# Patient Record
Sex: Female | Born: 1945 | Race: White | Hispanic: No | Marital: Married | Smoking: Former smoker
Health system: Southern US, Community
[De-identification: ages and names within clinical notes are randomized; demographics above are authoritative.]

## PROBLEM LIST (undated history)

## (undated) DIAGNOSIS — E7800 Pure hypercholesterolemia, unspecified: Secondary | ICD-10-CM

## (undated) DIAGNOSIS — M1990 Unspecified osteoarthritis, unspecified site: Secondary | ICD-10-CM

## (undated) DIAGNOSIS — K219 Gastro-esophageal reflux disease without esophagitis: Secondary | ICD-10-CM

## (undated) DIAGNOSIS — K589 Irritable bowel syndrome without diarrhea: Secondary | ICD-10-CM

## (undated) DIAGNOSIS — A048 Other specified bacterial intestinal infections: Secondary | ICD-10-CM

## (undated) DIAGNOSIS — E785 Hyperlipidemia, unspecified: Secondary | ICD-10-CM

## (undated) DIAGNOSIS — C801 Malignant (primary) neoplasm, unspecified: Secondary | ICD-10-CM

## (undated) HISTORY — DX: Irritable bowel syndrome, unspecified: K58.9

## (undated) HISTORY — DX: Gastro-esophageal reflux disease without esophagitis: K21.9

## (undated) HISTORY — DX: Pure hypercholesterolemia, unspecified: E78.00

## (undated) HISTORY — DX: Hyperlipidemia, unspecified: E78.5

## (undated) HISTORY — DX: Other specified bacterial intestinal infections: A04.8

## (undated) HISTORY — PX: TUBAL LIGATION: SHX77

---

## 2002-11-15 ENCOUNTER — Ambulatory Visit (HOSPITAL_COMMUNITY): Admission: RE | Admit: 2002-11-15 | Discharge: 2002-11-15 | Payer: Self-pay | Admitting: Internal Medicine

## 2002-11-15 HISTORY — PX: COLONOSCOPY: SHX174

## 2006-08-02 ENCOUNTER — Ambulatory Visit: Payer: Self-pay | Admitting: Gastroenterology

## 2010-03-23 DIAGNOSIS — A048 Other specified bacterial intestinal infections: Secondary | ICD-10-CM

## 2010-03-23 HISTORY — DX: Other specified bacterial intestinal infections: A04.8

## 2010-08-05 NOTE — Assessment & Plan Note (Signed)
[facility]                         GASTROENTEROLOGY OFFICE NOTE   NAME:Michele Villa, Michele Villa                      MRN:          [id number]  DATE:08/02/2006                            DOB:          1946/02/11    REASON FOR CONSULTATION:  Fever and abdominal pain.   Michele Villa is a 65 year old white female referred through the  courtesy of Dr.  Margo Common for evaluation. For the last 3 to 4 months, she  has been having what sounds like a rigor and low-grade fevers to 100 to  101. This may occur about once a month. On the last occasion, she  developed diffuse lower aching abdominal pain as well. This pain lasted  several hours. Michele Villa has a history of irritable bowel syndrome  characterized by alternating episodes of diarrhea and constipation. She  has frequent pyrosis and occasional regurgitation of gastric contents.  She recently underwent a CT scan that was unremarkable as well as a CBC.  She has frequent sinus drainage and non-productive cough. She denies  dysphagia or sore throat. She underwent colonoscopy four years ago that  apparently was normal.   PAST MEDICAL HISTORY:  1. Arthritis.  2. She is status post tubal ligation.   FAMILY HISTORY:  Is noncontributory.   CURRENT MEDICATIONS:  On no medications.   ALLERGIES:  She has no allergies.   SOCIAL HISTORY:  She does not smoke. She drinks rarely. She is married  and works as a Nutritional therapist.   REVIEW OF SYSTEMS:  Positive for joint pains, night sweats and back  pain.   PHYSICAL EXAMINATION:  Pulse 80, blood pressure 120/82, weight 139.   PHYSICAL EXAMINATION:  HEENT: EOMI. PERRLA. Sclerae are anicteric.  Conjunctivae are pink.  NECK:  Supple without thyromegaly, adenopathy or carotid bruits.  CHEST:  Clear to auscultation and percussion without adventitious  sounds.  CARDIAC:  Regular rhythm; normal S1 S2.  There are no murmurs, gallops  or rubs.  ABDOMEN:  Bowel sounds are  normoactive.  Abdomen is soft, non-tender and  non-distended.  There are no abdominal masses, tenderness, splenic  enlargement or hepatomegaly.  EXTREMITIES:  Full range of motion.  No cyanosis, clubbing or edema.  RECTAL:  Deferred.   IMPRESSION:  1. Abdominal pain, very nonspecific symptoms and it may be a      reflection of irritable bowel syndrome.  2. Irritable bowel syndrome.  3. History of low-grade fevers and chills. She has frequent sinus      drainage and may develop low-grade inflammation from this. There is      no evidence of active intra-abdominal process.  4. Gastroesophageal reflux disease.   RECOMMENDATION:  1. Fiber supplementation.  2. Prevacid 30 mg a day.   I carefully instructed Michele Villa to contact me if symptoms worsen  with regards to fevers, fever frequency or abdominal pain. In the  absence of this, I will reevaluate her in approximately one month.     Barbette Hair. Arlyce Dice, MD,FACG  Electronically Signed    RDK/MedQ  DD: 08/02/2006  DT: 08/02/2006  Job #: [id number]   cc:  Wyvonnia Lora

## 2010-08-05 NOTE — Letter (Signed)
Aug 02, 2006    Mrs. Willette Cluster   RE:  SHOLONDA, JOBST  MRN:  [id number]  /  DOB:  05/04/1945   Dear Mrs. Martian:   It is my pleasure to have treated you recently as a new patient in my  office.  I appreciate your confidence and the opportunity to participate  in your care.   Since I do have a busy inpatient endoscopy schedule and office schedule,  my office hours vary weekly.  I am, however, available for emergency  calls every day through my office.  If I cannot promptly meet an urgent  office appointment, another one of our gastroenterologists will be able  to assist you.   My well-trained staff are prepared to help you at all times.  For  emergencies after office hours, a physician from our gastroenterology  section is always available through my 24-hour answering service.   While you are under my care, I encourage discussion of your questions  and concerns, and I will be happy to return your calls as soon as I am  available.   Once again, I welcome you as a new patient and I look forward to a happy  and healthy relationship.    Sincerely,      Barbette Hair. Arlyce Dice, MD,FACG  Electronically Signed   RDK/MedQ  DD: 08/02/2006  DT: 08/02/2006  Job #: [id number]

## 2010-08-05 NOTE — Letter (Signed)
Aug 02, 2006    Wyvonnia Lora, MD  [address]  [place], [place] [address]   RE:  [name], [name]  MRN:  [id number]  /  DOB:  09-29-1945   Dear Dr. Margo Common:   Upon your kind referral, I had the pleasure of evaluating your patient  and I am pleased to offer my findings.  I saw Mrs. Michele Villa in the  office today.  Enclosed is a copy of my progress note that details my  findings and recommendations.   Thank you for the opportunity to participate in your patient's care.    Sincerely,      Barbette Hair. Arlyce Dice, MD,FACG  Electronically Signed    RDK/MedQ  DD: 08/02/2006  DT: 08/02/2006  Job #: [id number]

## 2010-08-08 NOTE — Consult Note (Signed)
NAME:  Michele Villa, Michele Villa               ACCOUNT NO.:  [id number]   MEDICAL RECORD NO.:  [id number]                  PATIENT TYPE:   LOCATION:                                       FACILITY:   PHYSICIAN:  R. Roetta Sessions, M.D.              DATE OF BIRTH:  07-11-1945   DATE OF CONSULTATION:  10/25/2002  DATE OF DISCHARGE:                                   CONSULTATION   CHIEF COMPLAINT:  Intermittent diarrhea.   HISTORY OF PRESENT ILLNESS:  Mrs. Michele Villa is a pleasant 65 year old  lady referred over at the courtesy of Dr. Wyvonnia Lora to further evaluate a  greater than 10-year history of intermittent postprandial abdominal cramps  and diarrhea.  She has one to six bowel movements daily.  On a good day she  may have one formed stool; on other days she may have six semi-formed loose  stools.  She really does not have any nocturnal diarrhea and has several  days out of a month where she has normal bowel function.  Sometimes she gets  the acute urge to have a bowel movement, occasionally has bouts of  incontinence.  She was last seen in 1995 for these symptoms.  Stool studies  were negative.  Sigmoidoscopy at 25 cm was negative at [facility].  It was felt she had diarrhea from irritable bowel syndrome.  I prescribed  her some sublingual Levsin.  She does not recall taking Levsin or any  benefit from that approach.  There is no family history of colorectal  neoplasia although her mother has colonic polyps and Michele Villa has never  had a full colonoscopy.  No upper GI tract symptoms such as odynophagia,  dysphagia, early satiety, reflux symptoms, nausea, vomiting.  Again, there  has been no melena or rectal bleeding.  She has gained 7 pounds since she  was last seen 10 years ago.   PAST MEDICAL HISTORY:  Otherwise unremarkable for chronic illnesses.   PAST SURGERY:  Tubal ligation.   CURRENT MEDICATIONS:  None.   ALLERGIES:  No known drug allergies.   FAMILY HISTORY:  Mother has chronic COPD, colonic polyps.  Father died with  lung cancer.  Otherwise negative.   SOCIAL HISTORY:  The patient has been married for 10 years, has one  daughter.  Employed with [employer].  Does not use tobacco.  Occasionally  has a glass of wine.   REVIEW OF SYSTEMS:  As in history of present illness.   PHYSICAL EXAMINATION:  GENERAL:  Reveals a pleasant 65 year old lady resting  comfortably.  VITAL SIGNS:  Weight 142, blood pressure 100/80, pulse 86.  SKIN:  Warm and dry, no lesions.  HEENT:  No scleral icterus, conjunctivae are pink. Oral cavity with no  lesions.  JVP is not prominent.  CHEST:  Lungs are clear to auscultation.  CARDIAC:  Regular rate and rhythm without murmur, gallop, or rub.  ABDOMEN:  Nondistended, positive bowel sounds, soft,  nontender.  No  appreciable mass or organomegaly.  RECTAL:  Deferred to time of colonoscopy.   IMPRESSION:  Mrs. Michele Villa is a pleasant 65 year old lady with  intermittent postprandial abdominal cramps and diarrhea, relatively rare  episodes of rectal incontinence.  Symptoms have been ongoing for some 10  years.   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  I  believe this diagnosis has been a durable one over time.   She would benefit from a multi-pronged approach to irritable bowel syndrome.  In addition, she really needs to have a colonoscopy as this has not been  done.  She is past the threshold age for colorectal cancer screening.  To  this end I have offered Mrs. Michele Villa a colonoscopy.  The potential risks,  benefits, alternatives have been reviewed.  Her questions were answered.  She is agreeable.   PLAN:  Colonoscopy in the near future at [facility].  Will then  make further recommendations in terms of the management of irritable bowel  syndrome.   I would like to thank Dr. Wyvonnia Lora for allowing me to see this nice lady  once again.                                                Jonathon Bellows, M.D.    RMR/MEDQ  D:  10/25/2002  T:  10/25/2002  Job:  [id number]   cc:   Wyvonnia Lora  [address]  [place]  [place] [address]  Fax: [phone]

## 2010-08-08 NOTE — Op Note (Signed)
NAME:  Michele Villa, Michele Villa                         ACCOUNT NO.:  [id number]   MEDICAL RECORD NO.:  [id number]                   PATIENT TYPE:  AMB   LOCATION:  DAY                                  FACILITY:  [facility]   PHYSICIAN:  R. Roetta Sessions, M.D.              DATE OF BIRTH:  1945/08/26   DATE OF PROCEDURE:  11/15/2002  DATE OF DISCHARGE:                                  PROCEDURE NOTE   PROCEDURE:  Screening colonoscopy.   INDICATIONS FOR PROCEDURE:  The patient is a 65 year old lady with  longstanding intermittent postprandial abdominal cramps and diarrhea, rare  episode of occasional rectal incontinence.  Symptoms have been going on for  over 10 years.  She is felt to have diarrhea-predominant IBS.  She has not  had her entire lower GI tract evaluated.  She is due for screening;  consequently, colonoscopy is now being done.  This approach has been  discussed with the patient.  Potential risks, benefits, alternatives have  been reviewed, questions answered.  Please see my October 25, 2002  Consultation Note.   PROCEDURE NOTE:  O2 saturation, blood pressure, pulse, and respirations were  monitored throughout the entire procedure.   CONSCIOUS SEDATION:  Versed 3 mg IV, Demerol 50 mg IV in divided doses.   INSTRUMENT:  Olympus video chip pediatric colonoscope.   FINDINGS:  Digital rectal exam revealed no abnormalities using the  ___________.  Her prep was good.   RECTOCOLON EXAMINATION:  Rectal mucosa including retroflexed view of the  anal verge revealed only a couple of anal papilla.   COLON:  Colonic mucosa was surveyed from the rectosigmoid junction through  the left, transverse, and right colon to the area of the appendiceal  orifice.   ILEOCECAL VALVE AND CECUM:  These structures well seen and photographed  throughout.  The colonic mucosa all the way to the cecum appeared normal.  The terminal ileum was entered at 10 cm.  This segment of the GI tract also  appeared  normal.  From this level, the scope was slowly withdrawn.  All  previously imaged mucosal surfaces were again seen and again no  abnormalities were observed.  The patient tolerated the procedure well, was  reacted in the endoscopy room.   IMPRESSION:  Two anal papilla; otherwise, normal rectum, normal colon,  normal terminal ileum.    RECOMMENDATIONS:  NuLev one tablet on the tongue before meals and at bedtime  as needed for occasional abdominal cramps and diarrhea.  Follow-up will be  in six months.                                               Jonathon Bellows, M.D.    RMR/MEDQ  D:  11/15/2002  T:  11/15/2002  Job:  [id number]   cc:   Wyvonnia Lora  [address]  [place]  [place] [address]  Fax: [phone]

## 2012-04-21 ENCOUNTER — Telehealth: Payer: Self-pay | Admitting: *Deleted

## 2012-04-21 NOTE — Telephone Encounter (Signed)
Michele Villa called today. She had a TCS 10 years ago and is due again this year. She would like to see if she can be triaged over the phone or if she will need an office visit. She is not due until August.  Please follow up with her. Thank you.

## 2012-04-25 NOTE — Telephone Encounter (Signed)
Pt returned call and said she is having some IBS problems. She is having more frequent Bm's and she is becoming incontinent of the stools. Ov scheduled with Lorenza Burton, NP on 05/12/2012 at 11:00 am.

## 2012-04-25 NOTE — Telephone Encounter (Signed)
LMOM to call.

## 2012-05-11 ENCOUNTER — Encounter: Payer: Self-pay | Admitting: Internal Medicine

## 2012-05-12 ENCOUNTER — Encounter: Payer: Self-pay | Admitting: Urgent Care

## 2012-05-12 ENCOUNTER — Ambulatory Visit (INDEPENDENT_AMBULATORY_CARE_PROVIDER_SITE_OTHER): Payer: 59 | Admitting: Urgent Care

## 2012-05-12 VITALS — BP 129/77 | HR 74 | Temp 97.6°F | Ht 63.5 in | Wt 135.6 lb

## 2012-05-12 DIAGNOSIS — R1310 Dysphagia, unspecified: Secondary | ICD-10-CM | POA: Insufficient documentation

## 2012-05-12 DIAGNOSIS — R197 Diarrhea, unspecified: Secondary | ICD-10-CM | POA: Insufficient documentation

## 2012-05-12 DIAGNOSIS — K219 Gastro-esophageal reflux disease without esophagitis: Secondary | ICD-10-CM

## 2012-05-12 DIAGNOSIS — R1314 Dysphagia, pharyngoesophageal phase: Secondary | ICD-10-CM

## 2012-05-12 DIAGNOSIS — R159 Full incontinence of feces: Secondary | ICD-10-CM

## 2012-05-12 MED ORDER — HYOSCYAMINE SULFATE 0.125 MG SL SUBL: 0.1250 mg | SUBLINGUAL_TABLET | SUBLINGUAL | Status: DC | PRN

## 2012-05-12 MED ORDER — PEG 3350-KCL-NA BICARB-NACL 420 G PO SOLR: 4000.0000 mL | ORAL | Status: DC

## 2012-05-12 NOTE — Assessment & Plan Note (Addendum)
Michele Villa is a pleasant 67 y.o. female with history of IBS-D. Last colonoscopy was back in 2004. Over the last several years, she reports irritable bowel syndrome has been worse. She is having symptoms nearly every day now including diarrhea, hematochezia and fecal incontinence. She'll need an ileocolonoscopy with Dr. Jena Gauss with possible random biopsies to rule out microscopic colitis, inflammatory bowel disease, colorectal polyp or carcinom, or benign anorectal source.   I have discussed risks & benefits which include, but are not limited to, bleeding, infection, perforation & drug reaction.  The patient agrees with this plan & written consent will be obtained.    TTG IgA, IgA, CBC, CMP, TSH Use Levsin 0.125 mg before meals and at bedtime (up to 4 times a day) for abdominal cramps and diarrhea Call if any questions or problems.

## 2012-05-12 NOTE — Patient Instructions (Addendum)
You will need a colonoscopy, upper endoscopy, possible colon and small bowel biopsies, and possible dilation of your esophagus with Dr. Jena Gauss Use Levsin 0.125 mg before meals and at bedtime (up to 4 times a day) for abdominal cramps and diarrhea Please get your labs as soon as possible.  We will call you with results. Continue omeprazole 20 mg daily for acid reflux Call if any questions or problems. Dysphagia Swallowing problems (dysphagia) occur when solids and liquids seem to stick in your throat on the way down to your stomach, or the food takes longer to get to the stomach. Other symptoms (problems) include regurgitating (burping) up food, noises coming from the throat, chest discomfort with swallowing, and a feeling of fullness in the throat when swallowing. When blockage in the throat is complete it may be associated with drooling. CAUSES There are many causes of swallowing difficulties and the following is generalized information regarding a number of reasons for this problem. Problems with swallowing may occur because of problems with the muscles. The food cannot be propelled in the usual manner into the stomach. There may be ulcers, scar tissue, or inflammation (soreness) in the esophagus (the food tube from the mouth to the stomach) which blocks food from passing normally into the stomach. Causes of inflammation include acid reflux from the stomach into the esophagus. Inflammation can also be caused by the herpes simplex virus, Candida (yeast), radiation (as with treatment of cancer), or inflammation from medicines not taken with adequate fluids to wash them down into the stomach. There may be nerve problems so signals cannot be sent adequately telling the muscles of the esophagus to contract and move the food along. Achalasia is a rare disorder of the esophagus in which muscular contractions of the esophagus are uncoordinated. Globus hystericus is a relatively common problem in young females in  which there is a sense of an obstruction or difficulty in swallowing, but in which no abnormalities can be found. This problem usually improves over time with reassurance and testing to rule out other causes. DIAGNOSIS A number of tests will help your caregiver know what is the cause of your swallowing problems. These tests may include a barium swallow in which X-rays are taken while you are drinking a liquid that outlines the lining of the esophagus on X-ray. If the stomach and small bowel are also studied in this manner it is called an upper gastrointestinal exam (UGI). Endoscopy may be done in which your caregiver examines your throat, esophagus, stomach, and small bowel with a small, flexible scope. Motility studies which measure the effectiveness and coordination of the muscular contractions of the esophagus may also be done. TREATMENT The treatment of swallowing problems are many, varying from medicines to surgical treatment. The treatment varies with the type of problem found. Your caregiver will discuss your results and treatment with you. If swallowing problems are severe the long-term problems which may occur include: malnutrition, pneumonia (from food going into the breathing tubes called trachea and bronchi), and an increase in tumors (lumps) of the esophagus. SEEK IMMEDIATE MEDICAL CARE IF:  Food or another object becomes lodged in your throat or esophagus and will not move. Document Released: 03/06/2000 Document Revised: 09/08/2011 Document Reviewed: 10/26/2007 [facility] Patient Information 2013 [place], [place]. Diarrhea Infections caused by germs (bacterial) or a virus commonly cause diarrhea. Your caregiver has determined that with time, rest and fluids, the diarrhea should improve. In general, eat normally while drinking more water than usual. Although water may prevent  dehydration, it does not contain salt and minerals (electrolytes). Broths, weak tea without caffeine and oral rehydration  solutions (ORS) replace fluids and electrolytes. Small amounts of fluids should be taken frequently. Large amounts at one time may not be tolerated. Plain water may be harmful in infants and the elderly. Oral rehydrating solutions (ORS) are available at pharmacies and grocery stores. ORS replace water and important electrolytes in proper proportions. Sports drinks are not as effective as ORS and may be harmful due to sugars worsening diarrhea.  ORS is especially recommended for use in children with diarrhea. As a general guideline for children, replace any new fluid losses from diarrhea and/or vomiting with ORS as follows:  If your child weighs 22 pounds or under (10 kg or less), give 60-120 mL ( -  cup or 2 - 4 ounces) of ORS for each episode of diarrheal stool or vomiting episode.  If your child weighs more than 22 pounds (more than 10 kgs), give 120-240 mL ( - 1 cup or 4 - 8 ounces) of ORS for each diarrheal stool or episode of vomiting.  While correcting for dehydration, children should eat normally. However, foods high in sugar should be avoided because this may worsen diarrhea. Large amounts of carbonated soft drinks, juice, gelatin desserts and other highly sugared drinks should be avoided.  After correction of dehydration, other liquids that are appealing to the child may be added. Children should drink small amounts of fluids frequently and fluids should be increased as tolerated. Children should drink enough fluids to keep urine clear or pale yellow.  Adults should eat normally while drinking more fluids than usual. Drink small amounts of fluids frequently and increase as tolerated. Drink enough fluids to keep urine clear or pale yellow. Broths, weak decaffeinated tea, lemon lime soft drinks (allowed to go flat) and ORS replace fluids and electrolytes.  Avoid:  Carbonated drinks.  Juice.  Extremely hot or cold fluids.  Caffeine drinks.  Fatty, greasy  foods.  Alcohol.  Tobacco.  Too much intake of anything at one time.  Gelatin desserts.  Probiotics are active cultures of beneficial bacteria. They may lessen the amount and number of diarrheal stools in adults. Probiotics can be found in yogurt with active cultures and in supplements.  Wash hands well to avoid spreading bacteria and virus.  Anti-diarrheal medications are not recommended for infants and children.  Only take over-the-counter or prescription medicines for pain, discomfort or fever as directed by your caregiver. Do not give aspirin to children because it may cause Reye's Syndrome.  For adults, ask your caregiver if you should continue all prescribed and over-the-counter medicines.  If your caregiver has given you a follow-up appointment, it is very important to keep that appointment. Not keeping the appointment could result in a chronic or permanent injury, and disability. If there is any problem keeping the appointment, you must call back to this facility for assistance. SEEK IMMEDIATE MEDICAL CARE IF:   You or your child is unable to keep fluids down or other symptoms or problems become worse in spite of treatment.  Vomiting or diarrhea develops and becomes persistent.  There is vomiting of blood or bile (green material).  There is blood in the stool or the stools are black and tarry.  There is no urine output in 6-8 hours or there is only a small amount of very dark urine.  Abdominal pain develops, increases or localizes.  You have a fever.  Your baby is older than  3 months with a rectal temperature of 102 F (38.9 C) or higher.  Your baby is 54 months old or younger with a rectal temperature of 100.4 F (38 C) or higher.  You or your child develops excessive weakness, dizziness, fainting or extreme thirst.  You or your child develops a rash, stiff neck, severe headache or become irritable or sleepy and difficult to awaken. MAKE SURE YOU:   Understand  these instructions.  Will watch your condition.  Will get help right away if you are not doing well or get worse. Document Released: 02/27/2002 Document Revised: 06/01/2011 Document Reviewed: 01/14/2009 [facility] Patient Information 2013 [place], [place].

## 2012-05-12 NOTE — Progress Notes (Signed)
Primary Care Physician:  Estanislado Pandy, MD Primary Gastroenterologist:  Dr. Jena Gauss  Chief Complaint  Patient presents with  . Diarrhea  . Irritable Bowel Syndrome    HPI:  Michele Villa is a 67 y.o. female here due to worsening diarrhea.  She had hx of IBS-D.  Her last colonoscopy was August 2004 by Dr. Jena Gauss and she had 2 anal papilla and otherwise normal exam. She has been having 4-6 small BMs with incontinence if she does not go right away.  She describes her stools as Bristol #5.  Denies mucus or blood in her stools.  She has been exercising & walking & she has to run to the bathroom with urgency. She has noticed bright red blood in her underwear.  She feels irritated.  Denies fever, chills or rash.  She has intermittent proctalgia and anorectal pruritis.  She has been taking OTC anti-diarrheal meds when necessary every 2-3 days, then she feels constipated.   Denies nausea, vomiting, dysphagia, odynophagia or anorexia.  She has also noticed heartburn & indigestion that is worse over the last 10 years.  She is on prilosec 20mg  daily PRN.  She has been having dysphagia.  She needs to eat her food slowly.  She feels like solid foods get stuck retrosternally.  She denies any problems w/ liquids.  Denies regurgitation.  Denies recent antibiotics, foreign travel or new medications. Her weight has been stable. Past Medical History  Diagnosis Date  . IBS (irritable bowel syndrome)   . Hyperlipidemia   . GERD (gastroesophageal reflux disease)   . H. pylori infection 2012    treated by Dr Neita Carp    Past Surgical History  Procedure Laterality Date  . Colonoscopy  11/15/2002    RMR:Two anal papilla; otherwise, normal rectum, normal colon normal terminal ileum  . Tubal ligation      Current Outpatient Prescriptions  Medication Sig Dispense Refill  . hyoscyamine (LEVSIN SL) 0.125 MG SL tablet Place 1 tablet (0.125 mg total) under the tongue every 4 (four) hours as needed for cramping.  60 tablet   0  . polyethylene glycol-electrolytes (TRILYTE) 420 G solution Take 4,000 mLs by mouth as directed.  4000 mL  0   No current facility-administered medications for this visit.    Allergies as of 05/12/2012 - Review Complete 05/12/2012  Allergen Reaction Noted  . Statins  05/12/2012    Family History:There is no known family history of colorectal carcinoma , liver disease, or inflammatory bowel disease.  Problem Relation Age of Onset  . GER disease Mother   . Diabetes Brother   . CAD    . Lung cancer Father   . GER disease Father     History   Social History  . Marital Status: Married    Spouse Name: N/A    Number of Children: 1  . Years of Education: N/A   Occupational History  . retired, [employer] Nutritional therapist    Social History Main Topics  . Smoking status: Former Smoker -- 15.00 packs/day    Types: Cigarettes    Quit date: 03/23/1980  . Smokeless tobacco: Not on file  . Alcohol Use: No     Comment: wine or mixed drink couple times per mo  . Drug Use: No  . Sexually Active: Not on file   Other Topics Concern  . Not on file   Social History Narrative   Lives w/ husband    Review of Systems: Gen: Denies any fever, chills, sweats,  anorexia, fatigue, weakness, malaise, weight loss, and sleep disorder CV: Denies chest pain, angina, palpitations, syncope, orthopnea, PND, peripheral edema, and claudication. Resp: Denies dyspnea at rest, dyspnea with exercise, cough, sputum, wheezing, coughing up blood, and pleurisy. GI: Denies vomiting blood, jaundice. GU : Denies urinary burning, blood in urine, urinary frequency, urinary hesitancy, nocturnal urination, and urinary incontinence. MS: Denies joint pain, limitation of movement, and swelling, stiffness, low back pain, extremity pain. Denies muscle weakness, cramps, atrophy.  Derm: Denies rash, itching, dry skin, hives, moles, warts, or unhealing ulcers.  Psych: Denies depression, anxiety, memory loss, suicidal  ideation, hallucinations, paranoia, and confusion. Heme: Denies bruising, bleeding, and enlarged lymph nodes. Neuro:  Denies any headaches, dizziness, paresthesias. Endo:  Denies any problems with DM, thyroid, adrenal function.  Physical Exam: BP 129/77  Pulse 74  Temp(Src) 97.6 F (36.4 C) (Oral)  Ht 5' 3.5" (1.613 m)  Wt 135 lb 9.6 oz (61.508 kg)  BMI 23.64 kg/m2 No LMP recorded. Patient is not currently having periods (Reason: Other). General:   Alert,  Well-developed, well-nourished, pleasant and cooperative in NAD Head:  Normocephalic and atraumatic. Eyes:  Sclera clear, no icterus.   Conjunctiva pink. Ears:  Normal auditory acuity. Nose:  No deformity, discharge, or lesions. Mouth:  No deformity or lesions,oropharynx pink & moist. Neck:  Supple; no masses or thyromegaly. Lungs:  Clear throughout to auscultation.   No wheezes, crackles, or rhonchi. No acute distress. Heart:  Regular rate and rhythm; no murmurs, clicks, rubs,  or gallops. Abdomen:  Normal bowel sounds.  No bruits.  Soft, non-tender and non-distended without masses, hepatosplenomegaly or hernias noted.  No guarding or rebound tenderness.   Rectal:  Deferred.  Msk:  Symmetrical without gross deformities. Normal posture. Pulses:  Normal pulses noted. Extremities:  No clubbing or edema. Neurologic:  Alert and oriented x4;  grossly normal neurologically. Skin:  Intact without significant lesions or rashes. Lymph Nodes:  No significant cervical adenopathy. Psych:  Alert and cooperative. Normal mood and affect.

## 2012-05-12 NOTE — Assessment & Plan Note (Signed)
She has chronic GERD and solid food dysphagia. She's never had an EGD before.  Will need EGD with possible esophageal dilation with Dr. Jena Gauss to look for esophageal web, ring or stricture.  I have discussed risks & benefits which include, but are not limited to, bleeding, infection, perforation & drug reaction.  The patient agrees with this plan & written consent will be obtained.

## 2012-05-12 NOTE — Progress Notes (Signed)
Faxed to PCP

## 2012-05-12 NOTE — Assessment & Plan Note (Signed)
Will need colonoscopy to help determine source.

## 2012-05-12 NOTE — Assessment & Plan Note (Signed)
She is currently taking when necessary omeprazole. I've asked her to increase this to omeprazole 20 mg daily. EGD pending.

## 2012-05-13 ENCOUNTER — Encounter (HOSPITAL_COMMUNITY): Payer: Self-pay | Admitting: Pharmacy Technician

## 2012-05-13 LAB — COMPREHENSIVE METABOLIC PANEL
ALT: 14 U/L (ref 0–35)
CO2: 26 mEq/L (ref 19–32)
Sodium: 138 mEq/L (ref 135–145)
Total Bilirubin: 0.5 mg/dL (ref 0.3–1.2)
Total Protein: 6.6 g/dL (ref 6.0–8.3)

## 2012-05-13 LAB — CBC WITH DIFFERENTIAL/PLATELET
Eosinophils Absolute: 0.1 10*3/uL (ref 0.0–0.7)
Lymphocytes Relative: 35 % (ref 12–46)
Lymphs Abs: 1.7 10*3/uL (ref 0.7–4.0)
Neutrophils Relative %: 54 % (ref 43–77)
Platelets: 225 10*3/uL (ref 150–400)
RBC: 4.93 MIL/uL (ref 3.87–5.11)
WBC: 4.9 10*3/uL (ref 4.0–10.5)

## 2012-05-16 LAB — TISSUE TRANSGLUTAMINASE, IGA: Tissue Transglutaminase Ab, IgA: 4.2 U/mL (ref <20)

## 2012-05-17 NOTE — Progress Notes (Signed)
Quick Note:  Please let pt know Blood ct, electrolytes, liver & thyroid normal. No evidence of celiac dx on labs. Keep colonoscopy & EGD as planned PI:RJJOAC,ZYSA W, MD  ______

## 2012-05-18 NOTE — Progress Notes (Signed)
Quick Note:  Pt aware. Tcs/egd scheduled for 05/25/12 Dawn, please cc pcp ______

## 2012-05-18 NOTE — Progress Notes (Signed)
Results faxed to PCP 

## 2012-05-25 ENCOUNTER — Encounter (HOSPITAL_COMMUNITY): Payer: Self-pay | Admitting: *Deleted

## 2012-05-25 ENCOUNTER — Encounter (HOSPITAL_COMMUNITY)
Admission: RE | Disposition: A | Discharge status: Home / Self Care | Payer: Self-pay | Source: Ambulatory Visit | Attending: Internal Medicine

## 2012-05-25 ENCOUNTER — Ambulatory Visit (HOSPITAL_COMMUNITY)
Admission: RE | Admit: 2012-05-25 | Discharge: 2012-05-25 | Disposition: A | Discharge status: Home / Self Care | Payer: 59 | Source: Ambulatory Visit | Attending: Internal Medicine | Admitting: Internal Medicine

## 2012-05-25 DIAGNOSIS — R197 Diarrhea, unspecified: Secondary | ICD-10-CM

## 2012-05-25 DIAGNOSIS — R159 Full incontinence of feces: Secondary | ICD-10-CM

## 2012-05-25 DIAGNOSIS — E785 Hyperlipidemia, unspecified: Secondary | ICD-10-CM | POA: Insufficient documentation

## 2012-05-25 DIAGNOSIS — K219 Gastro-esophageal reflux disease without esophagitis: Secondary | ICD-10-CM

## 2012-05-25 DIAGNOSIS — K921 Melena: Secondary | ICD-10-CM | POA: Insufficient documentation

## 2012-05-25 DIAGNOSIS — K222 Esophageal obstruction: Secondary | ICD-10-CM

## 2012-05-25 DIAGNOSIS — K2100 Gastro-esophageal reflux disease with esophagitis, without bleeding: Secondary | ICD-10-CM | POA: Insufficient documentation

## 2012-05-25 DIAGNOSIS — R1310 Dysphagia, unspecified: Secondary | ICD-10-CM | POA: Insufficient documentation

## 2012-05-25 DIAGNOSIS — R1314 Dysphagia, pharyngoesophageal phase: Secondary | ICD-10-CM

## 2012-05-25 DIAGNOSIS — K5730 Diverticulosis of large intestine without perforation or abscess without bleeding: Secondary | ICD-10-CM | POA: Insufficient documentation

## 2012-05-25 HISTORY — PX: ESOPHAGOGASTRODUODENOSCOPY (EGD) WITH ESOPHAGEAL DILATION: SHX5812

## 2012-05-25 HISTORY — PX: COLONOSCOPY: SHX5424

## 2012-05-25 HISTORY — DX: Unspecified osteoarthritis, unspecified site: M19.90

## 2012-05-25 HISTORY — PX: BIOPSY: SHX5522

## 2012-05-25 SURGERY — COLONOSCOPY
Anesthesia: Moderate Sedation

## 2012-05-25 MED ORDER — MIDAZOLAM HCL 5 MG/5ML IJ SOLN
INTRAMUSCULAR | Status: AC
  Filled 2012-05-25: qty 10

## 2012-05-25 MED ORDER — ONDANSETRON HCL 4 MG/2ML IJ SOLN
INTRAMUSCULAR | Status: DC | PRN
  Administered 2012-05-25: 4 mg via INTRAVENOUS

## 2012-05-25 MED ORDER — MIDAZOLAM HCL 5 MG/5ML IJ SOLN
INTRAMUSCULAR | Status: DC | PRN
  Administered 2012-05-25: 2 mg via INTRAVENOUS
  Administered 2012-05-25: 1 mg via INTRAVENOUS
  Administered 2012-05-25: 2 mg via INTRAVENOUS

## 2012-05-25 MED ORDER — BUTAMBEN-TETRACAINE-BENZOCAINE 2-2-14 % EX AERO
INHALATION_SPRAY | CUTANEOUS | Status: DC | PRN
  Administered 2012-05-25: 1 via TOPICAL

## 2012-05-25 MED ORDER — ONDANSETRON HCL 4 MG/2ML IJ SOLN
INTRAMUSCULAR | Status: AC
  Filled 2012-05-25: qty 2

## 2012-05-25 MED ORDER — MEPERIDINE HCL 100 MG/ML IJ SOLN
INTRAMUSCULAR | Status: AC
  Filled 2012-05-25: qty 2

## 2012-05-25 MED ORDER — MEPERIDINE HCL 100 MG/ML IJ SOLN
INTRAMUSCULAR | Status: DC | PRN
  Administered 2012-05-25: 25 mg via INTRAVENOUS
  Administered 2012-05-25: 50 mg via INTRAVENOUS
  Administered 2012-05-25: 25 mg via INTRAVENOUS

## 2012-05-25 MED ORDER — SODIUM CHLORIDE 0.45 % IV SOLN
INTRAVENOUS | Status: DC
  Administered 2012-05-25: 1000 mL via INTRAVENOUS

## 2012-05-25 NOTE — H&P (View-Only) (Signed)
Primary Care Physician:  SASSER,PAUL W, MD Primary Gastroenterologist:  Dr. Rourk  Chief Complaint  Patient presents with  . Diarrhea  . Irritable Bowel Syndrome    HPI:  Michele Villa is a 67 y.o. female here due to worsening diarrhea.  She had hx of IBS-D.  Her last colonoscopy was August 2004 by Dr. Rourk and she had 2 anal papilla and otherwise normal exam. She has been having 4-6 small BMs with incontinence if she does not go right away.  She describes her stools as Bristol #5.  Denies mucus or blood in her stools.  She has been exercising & walking & she has to run to the bathroom with urgency. She has noticed bright red blood in her underwear.  She feels irritated.  Denies fever, chills or rash.  She has intermittent proctalgia and anorectal pruritis.  She has been taking OTC anti-diarrheal meds when necessary every 2-3 days, then she feels constipated.   Denies nausea, vomiting, dysphagia, odynophagia or anorexia.  She has also noticed heartburn & indigestion that is worse over the last 10 years.  She is on prilosec 20mg daily PRN.  She has been having dysphagia.  She needs to eat her food slowly.  She feels like solid foods get stuck retrosternally.  She denies any problems w/ liquids.  Denies regurgitation.  Denies recent antibiotics, foreign travel or new medications. Her weight has been stable. Past Medical History  Diagnosis Date  . IBS (irritable bowel syndrome)   . Hyperlipidemia   . GERD (gastroesophageal reflux disease)   . H. pylori infection 2012    treated by Dr Sasser    Past Surgical History  Procedure Laterality Date  . Colonoscopy  11/15/2002    RMR:Two anal papilla; otherwise, normal rectum, normal colon normal terminal ileum  . Tubal ligation      Current Outpatient Prescriptions  Medication Sig Dispense Refill  . hyoscyamine (LEVSIN SL) 0.125 MG SL tablet Place 1 tablet (0.125 mg total) under the tongue every 4 (four) hours as needed for cramping.  60 tablet   0  . polyethylene glycol-electrolytes (TRILYTE) 420 G solution Take 4,000 mLs by mouth as directed.  4000 mL  0   No current facility-administered medications for this visit.    Allergies as of 05/12/2012 - Review Complete 05/12/2012  Allergen Reaction Noted  . Statins  05/12/2012    Family History:There is no known family history of colorectal carcinoma , liver disease, or inflammatory bowel disease.  Problem Relation Age of Onset  . GER disease Mother   . Diabetes Brother   . CAD    . Lung cancer Father   . GER disease Father     History   Social History  . Marital Status: Married    Spouse Name: N/A    Number of Children: 1  . Years of Education: N/A   Occupational History  . retired, [employer] Switchboard Operator    Social History Main Topics  . Smoking status: Former Smoker -- 15.00 packs/day    Types: Cigarettes    Quit date: 03/23/1980  . Smokeless tobacco: Not on file  . Alcohol Use: No     Comment: wine or mixed drink couple times per mo  . Drug Use: No  . Sexually Active: Not on file   Other Topics Concern  . Not on file   Social History Narrative   Lives w/ husband    Review of Systems: Gen: Denies any fever, chills, sweats,   anorexia, fatigue, weakness, malaise, weight loss, and sleep disorder CV: Denies chest pain, angina, palpitations, syncope, orthopnea, PND, peripheral edema, and claudication. Resp: Denies dyspnea at rest, dyspnea with exercise, cough, sputum, wheezing, coughing up blood, and pleurisy. GI: Denies vomiting blood, jaundice. GU : Denies urinary burning, blood in urine, urinary frequency, urinary hesitancy, nocturnal urination, and urinary incontinence. MS: Denies joint pain, limitation of movement, and swelling, stiffness, low back pain, extremity pain. Denies muscle weakness, cramps, atrophy.  Derm: Denies rash, itching, dry skin, hives, moles, warts, or unhealing ulcers.  Psych: Denies depression, anxiety, memory loss, suicidal  ideation, hallucinations, paranoia, and confusion. Heme: Denies bruising, bleeding, and enlarged lymph nodes. Neuro:  Denies any headaches, dizziness, paresthesias. Endo:  Denies any problems with DM, thyroid, adrenal function.  Physical Exam: BP 129/77  Pulse 74  Temp(Src) 97.6 F (36.4 C) (Oral)  Ht 5' 3.5" (1.613 m)  Wt 135 lb 9.6 oz (61.508 kg)  BMI 23.64 kg/m2 No LMP recorded. Patient is not currently having periods (Reason: Other). General:   Alert,  Well-developed, well-nourished, pleasant and cooperative in NAD Head:  Normocephalic and atraumatic. Eyes:  Sclera clear, no icterus.   Conjunctiva pink. Ears:  Normal auditory acuity. Nose:  No deformity, discharge, or lesions. Mouth:  No deformity or lesions,oropharynx pink & moist. Neck:  Supple; no masses or thyromegaly. Lungs:  Clear throughout to auscultation.   No wheezes, crackles, or rhonchi. No acute distress. Heart:  Regular rate and rhythm; no murmurs, clicks, rubs,  or gallops. Abdomen:  Normal bowel sounds.  No bruits.  Soft, non-tender and non-distended without masses, hepatosplenomegaly or hernias noted.  No guarding or rebound tenderness.   Rectal:  Deferred.  Msk:  Symmetrical without gross deformities. Normal posture. Pulses:  Normal pulses noted. Extremities:  No clubbing or edema. Neurologic:  Alert and oriented x4;  grossly normal neurologically. Skin:  Intact without significant lesions or rashes. Lymph Nodes:  No significant cervical adenopathy. Psych:  Alert and cooperative. Normal mood and affect.    

## 2012-05-25 NOTE — Interval H&P Note (Signed)
History and Physical Interval Note:  05/25/2012 7:39 AM  Michele Villa  has presented today for surgery, with the diagnosis of Chronic Diarrhea, Dysphagia, Incontinance, GERD  The various methods of treatment have been discussed with the patient and family. After consideration of risks, benefits and other options for treatment, the patient has consented to  Procedure(s) with comments: COLONOSCOPY (N/A) - 7:30 ESOPHAGOGASTRODUODENOSCOPY (EGD) WITH ESOPHAGEAL DILATION (N/A) BIOPSY (N/A) - Poss random small bowel biopsy as a surgical intervention .  The patient's history has been reviewed, patient examined, no change in status, stable for surgery.  I have reviewed the patient's chart and labs.  Questions were answered to the patient's satisfaction.     Robert Rourk  EGD with esophageal dilation as appropriate and colonoscopy per plan.  The risks, benefits, limitations, imponderables and alternatives regarding both EGD and colonoscopy have been reviewed with the patient. Questions have been answered. All parties agreeable.

## 2012-05-25 NOTE — Op Note (Signed)
[facility] [address] [place], [address]   COLONOSCOPY PROCEDURE REPORT  PATIENT: Michele Villa, Michele Villa  MR#:         [id number] BIRTHDATE: 1945-07-15 , 67  yrs. old GENDER: Female ENDOSCOPIST: R.  Roetta Sessions, MD FACP FACG REFERRED BY:  Fara Chute, M.D. PROCEDURE DATE:  05/25/2012 PROCEDURE:   ileocolonoscopy with segmental biopsy  INDICATIONS: Chronic diarrhea; hematochezia  INFORMED CONSENT:  The risks, benefits, alternatives and imponderables including but not limited to bleeding, perforation as well as the possibility of a missed lesion have been reviewed.  The potential for biopsy, lesion removal, etc. have also been discussed.  Questions have been answered.  All parties agreeable. Please see the history and physical in the medical record for more information.  MEDICATIONS: Versed 5 mg IV and Demerol 125 mg IV in divided doses. Zofran 4 mg only  DESCRIPTION OF PROCEDURE:  After a digital rectal exam was performed, the EG-[id number] ([id number]) and Pentax Colonoscope [id number] colonoscope was advanced from the anus through the rectum and colon to the area of the cecum, ileocecal valve and appendiceal orifice. The cecum was deeply intubated.  These structures were well-seen and photographed for the record.  From the level of the cecum and ileocecal valve, the scope was slowly and cautiously withdrawn. The mucosal surfaces were carefully surveyed utilizing scope tip deflection to facilitate fold flattening as needed.  The scope was pulled down into the rectum where a thorough examination  was performed.    FINDINGS:  Adequate prep. Rectal vault small. Unable to retroflex. Rectal mucosa seen well on-face. Small anal canal hemorrhoids; otherwise, normal. Shallow left-sided diverticula; remainder of colonic mucosa appeared normal. Normal appearing distal 10 cm of terminal ileal mucosa  THERAPEUTIC / DIAGNOSTIC MANEUVERS PERFORMED:  Segment biopsies of the  ascending and sigmoid segments taken to evaluate for microscopic colitis  COMPLICATIONS: None  CECAL WITHDRAWAL TIME:  10 minutes  IMPRESSION:  Shallow left sided colonic diverticulosis. Minimally hemorrhoids. Status post segmental biopsy  RECOMMENDATIONS: Followup on pathology. See EGD report   _______________________________ eSigned:  R. Roetta Sessions, MD FACP [facility] 05/25/2012 8:48 AM   CC:

## 2012-05-25 NOTE — Op Note (Signed)
[facility] [address] [place], [address]   ENDOSCOPY PROCEDURE REPORT  PATIENT: Michele Villa, Michele Villa  MR#: [id number] BIRTHDATE: April 22, 1945 , 67  yrs. old GENDER: Female ENDOSCOPIST: R.  Roetta Sessions, MD FACP FACG REFERRED BY:  Fara Chute, M.D. PROCEDURE DATE:  05/25/2012 PROCEDURE:     EGD with Elease Hashimoto dilation followed by duodenal biopsy  INDICATIONS:    GERD/dysphagia/chronic diarrhea  INFORMED CONSENT:   The risks, benefits, limitations, alternatives and imponderables have been discussed.  The potential for biopsy, esophogeal dilation, etc. have also been reviewed.  Questions have been answered.  All parties agreeable.  Please see the history and physical in the medical record for more information.  MEDICATIONS:   Versed 5 mg IV and Demerol 100 mg IV in divided doses. Cetacaine spray. Zofran 4 mg IV  DESCRIPTION OF PROCEDURE:   The [id number] ([id number])  endoscope was introduced through the mouth and advanced to the second portion of the duodenum without difficulty or limitations.  The mucosal surfaces were surveyed very carefully during advancement of the scope and upon withdrawal.  Retroflexion view of the proximal stomach and esophagogastric junction was performed.      FINDINGS: Soft, noncritical distal esophageal peptic appearing stricture with overlying erosions. No Barrett's esophagus. No tumor. Stomach empty. Small hiatal hernia; otherwise normal gastric mucosa. Patent pylorus. Examination of the first second and third portion of duodenum revealed no abnormalities per  THERAPEUTIC / DIAGNOSTIC MANEUVERS PERFORMED:  A 54 French Maloney dilator was passed to full insertion easily. A look back revealed nice dilation of the stricture with minimal bleeding and no apparent complication.  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 Hiatal hernia. Status post duodenal biopsy.  RECOMMENDATIONS:  Prilosec; Begin Dexilant 60 Mg Daily. Follow up on Pathology. See Colonoscopy Report.    _______________________________ R. Roetta Sessions, MD FACP [facility] eSigned:  R. Roetta Sessions, MD FACP [facility] 05/25/2012 8:10 AM     CC:

## 2012-05-29 ENCOUNTER — Encounter: Payer: Self-pay | Admitting: Internal Medicine

## 2012-05-30 ENCOUNTER — Encounter: Payer: Self-pay | Admitting: *Deleted

## 2012-05-30 ENCOUNTER — Encounter (HOSPITAL_COMMUNITY): Payer: Self-pay | Admitting: Internal Medicine

## 2012-06-01 ENCOUNTER — Telehealth: Payer: Self-pay | Admitting: Internal Medicine

## 2012-06-01 NOTE — Telephone Encounter (Signed)
Pt called this afternoon asking if her results were back yet. Also, she was asking about a follow up visit. She has OV for 3/31 at 1030 with LSL and I had mailed her an appt card. She must not have received it yet. Please call her back at [phone]

## 2012-06-01 NOTE — Telephone Encounter (Signed)
Spoke with pt- went over letter that RMR had sent to her. She has not received it yet. Informed her of her appt with LSL.

## 2012-06-06 ENCOUNTER — Telehealth: Payer: Self-pay | Admitting: Internal Medicine

## 2012-06-06 NOTE — Telephone Encounter (Signed)
Pt had tcs on 3/5 by RMR and has questions about the medication he put her on. Please call her back at [phone]

## 2012-06-07 NOTE — Telephone Encounter (Signed)
Tried to call pt- LMOM 

## 2012-06-08 NOTE — Telephone Encounter (Signed)
Tried to call pt- LMOM 

## 2012-06-10 NOTE — Telephone Encounter (Signed)
Spoke with pt- she had a couple of nights with really bad reflux and she didn't think the reflux medication was working. She has not had any problems since and has a follow up appt with [place] at the end of the month and will discuss everything when she comes in.

## 2012-06-16 ENCOUNTER — Encounter: Payer: Self-pay | Admitting: Internal Medicine

## 2012-06-20 ENCOUNTER — Encounter: Payer: Self-pay | Admitting: Gastroenterology

## 2012-06-20 ENCOUNTER — Ambulatory Visit (INDEPENDENT_AMBULATORY_CARE_PROVIDER_SITE_OTHER): Payer: Medicare Other | Admitting: Gastroenterology

## 2012-06-20 VITALS — BP 106/63 | HR 77 | Temp 97.9°F | Ht 63.5 in | Wt 134.2 lb

## 2012-06-20 DIAGNOSIS — K219 Gastro-esophageal reflux disease without esophagitis: Secondary | ICD-10-CM

## 2012-06-20 DIAGNOSIS — K589 Irritable bowel syndrome without diarrhea: Secondary | ICD-10-CM | POA: Insufficient documentation

## 2012-06-20 MED ORDER — PANTOPRAZOLE SODIUM 40 MG PO TBEC: 40.0000 mg | DELAYED_RELEASE_TABLET | Freq: Every day | ORAL | Status: DC

## 2012-06-20 NOTE — Progress Notes (Signed)
Primary Care Physician: Estanislado Pandy, MD  Primary Gastroenterologist:  Roetta Sessions, MD   Chief Complaint  Patient presents with  . Follow-up  . Advice Only    HPI: Michele Villa is a 67 y.o. female here for followup of recent EGD and colonoscopy. She had erosive reflux esophagitis superimposed on a soft distal peptic stricture which was dilated, hiatal hernia, normal small bowel biopsy. Colonoscopy showed diverticula, random colon biopsies negative. The patient also had normal TSH, CBC, LFTs, basic metabolic panel, tissue transglutaminase and IgA levels.  Dexilant not on insurance plan, seems to be working well but she needs to change. She has been on Prilosec, Nexium, protonix some the past but never take any on a chronic basis. At the time of her upper endoscopy she just recently started back on omeprazole. She continues to have several bowel movements per day. Symptoms seem to be worse in the morning. She may have 3-5 small bowel movements per se in the morning. She states she has to continue to go back like she's not done. The remaining part of the day she seems to do well. No further episodes of incontinence recently. She has tried the Levsin but thought it was only for cramps so has not taken very much. She denies having any significant abdominal cramping. She finds that Imodium as needed tends to work well for her. She can take 1 or 2 if she does well for several days.     Current Outpatient Prescriptions  Medication Sig Dispense Refill  . calcium carbonate (OS-CAL) 600 MG TABS Take 600 mg by mouth 2 (two) times daily with a meal.      . DEXILANT 60 MG capsule Take 60 mg by mouth daily.       . hyoscyamine (LEVSIN SL) 0.125 MG SL tablet Place 1 tablet (0.125 mg total) under the tongue every 4 (four) hours as needed for cramping.  60 tablet  0   No current facility-administered medications for this visit.    Allergies as of 06/20/2012 - Review Complete 06/20/2012  Allergen  Reaction Noted  . Statins Hives and Itching 05/12/2012    ROS:  General: Negative for anorexia, weight loss, fever, chills, fatigue, weakness. ENT: Negative for hoarseness, difficulty swallowing , nasal congestion. CV: Negative for chest pain, angina, palpitations, dyspnea on exertion, peripheral edema.  Respiratory: Negative for dyspnea at rest, dyspnea on exertion, cough, sputum, wheezing.  GI: See history of present illness. GU:  Negative for dysuria, hematuria, urinary incontinence, urinary frequency, nocturnal urination.  Endo: Negative for unusual weight change.    Physical Examination:   BP 106/63  Pulse 77  Temp(Src) 97.9 F (36.6 C) (Oral)  Ht 5' 3.5" (1.613 m)  Wt 134 lb 3.2 oz (60.873 kg)  BMI 23.4 kg/m2  General: Well-nourished, well-developed in no acute distress.  Eyes: No icterus. Mouth: Oropharyngeal mucosa moist and pink , no lesions erythema or exudate. Lungs: Clear to auscultation bilaterally.  Heart: Regular rate and rhythm, no murmurs rubs or gallops.  Abdomen: Bowel sounds are normal, nontender, nondistended, no hepatosplenomegaly or masses, no abdominal bruits or hernia , no rebound or guarding.   Extremities: No lower extremity edema. No clubbing or deformities. Neuro: Alert and oriented x 4   Skin: Warm and dry, no jaundice.   Psych: Alert and cooperative, normal mood and affect.   Imaging Studies: No results found.

## 2012-06-20 NOTE — Progress Notes (Signed)
CC PCP 

## 2012-06-20 NOTE — Patient Instructions (Addendum)
Stop Dexilant. Start Protonix one capsule 30 minutes before your evening meal each day. You may continue imodium as needed for diarrhea. You may also take the Levsin (hyoscyamine) as prescribed for diarrhea.  Take probiotic once daily for four weeks.   Gastroesophageal Reflux Disease, Adult Gastroesophageal reflux disease (GERD) happens when acid from your stomach flows up into the esophagus. When acid comes in contact with the esophagus, the acid causes soreness (inflammation) in the esophagus. Over time, GERD may create small holes (ulcers) in the lining of the esophagus. CAUSES   Increased body weight. This puts pressure on the stomach, making acid rise from the stomach into the esophagus.  Smoking. This increases acid production in the stomach.  Drinking alcohol. This causes decreased pressure in the lower esophageal sphincter (valve or ring of muscle between the esophagus and stomach), allowing acid from the stomach into the esophagus.  Late evening meals and a full stomach. This increases pressure and acid production in the stomach.  A malformed lower esophageal sphincter. Sometimes, no cause is found. SYMPTOMS   Burning pain in the lower part of the mid-chest behind the breastbone and in the mid-stomach area. This may occur twice a week or more often.  Trouble swallowing.  Sore throat.  Dry cough.  Asthma-like symptoms including chest tightness, shortness of breath, or wheezing. DIAGNOSIS  Your caregiver may be able to diagnose GERD based on your symptoms. In some cases, X-rays and other tests may be done to check for complications or to check the condition of your stomach and esophagus. TREATMENT  Your caregiver may recommend over-the-counter or prescription medicines to help decrease acid production. Ask your caregiver before starting or adding any new medicines.  HOME CARE INSTRUCTIONS   Change the factors that you can control. Ask your caregiver for guidance concerning  weight loss, quitting smoking, and alcohol consumption.  Avoid foods and drinks that make your symptoms worse, such as:  Caffeine or alcoholic drinks.  Chocolate.  Peppermint or mint flavorings.  Garlic and onions.  Spicy foods.  Citrus fruits, such as oranges, lemons, or limes.  Tomato-based foods such as sauce, chili, salsa, and pizza.  Fried and fatty foods.  Avoid lying down for the 3 hours prior to your bedtime or prior to taking a nap.  Eat small, frequent meals instead of large meals.  Wear loose-fitting clothing. Do not wear anything tight around your waist that causes pressure on your stomach.  Raise the head of your bed 6 to 8 inches with wood blocks to help you sleep. Extra pillows will not help.  Only take over-the-counter or prescription medicines for pain, discomfort, or fever as directed by your caregiver.  Do not take aspirin, ibuprofen, or other nonsteroidal anti-inflammatory drugs (NSAIDs). SEEK IMMEDIATE MEDICAL CARE IF:   You have pain in your arms, neck, jaw, teeth, or back.  Your pain increases or changes in intensity or duration.  You develop nausea, vomiting, or sweating (diaphoresis).  You develop shortness of breath, or you faint.  Your vomit is green, yellow, black, or looks like coffee grounds or blood.  Your stool is red, bloody, or black. These symptoms could be signs of other problems, such as heart disease, gastric bleeding, or esophageal bleeding. MAKE SURE YOU:   Understand these instructions.  Will watch your condition.  Will get help right away if you are not doing well or get worse. Document Released: 12/17/2004 Document Revised: 06/01/2011 Document Reviewed: 09/26/2010 [facility] Patient Information 2013 [place], [place].  Irritable Bowel Syndrome Irritable Bowel Syndrome (IBS) is caused by a disturbance of normal bowel function. Other terms used are spastic colon, mucous colitis, and irritable colon. It does not require  surgery, nor does it lead to cancer. There is no cure for IBS. But with proper diet, stress reduction, and medication, you will find that your problems (symptoms) will gradually disappear or improve. IBS is a common digestive disorder. It usually appears in late adolescence or early adulthood. Women develop it twice as often as men. CAUSES  After food has been digested and absorbed in the small intestine, waste material is moved into the colon (large intestine). In the colon, water and salts are absorbed from the undigested products coming from the small intestine. The remaining residue, or fecal material, is held for elimination. Under normal circumstances, gentle, rhythmic contractions on the bowel walls push the fecal material along the colon towards the rectum. In IBS, however, these contractions are irregular and poorly coordinated. The fecal material is either retained too long, resulting in constipation, or expelled too soon, producing diarrhea. SYMPTOMS  The most common symptom of IBS is pain. It is typically in the lower left side of the belly (abdomen). But it may occur anywhere in the abdomen. It can be felt as heartburn, backache, or even as a dull pain in the arms or shoulders. The pain comes from excessive bowel-muscle spasms and from the buildup of gas and fecal material in the colon. This pain:  Can range from sharp belly (abdominal) cramps to a dull, continuous ache.  Usually worsens soon after eating.  Is typically relieved by having a bowel movement or passing gas. Abdominal pain is usually accompanied by constipation. But it may also produce diarrhea. The diarrhea typically occurs right after a meal or upon arising in the morning. The stools are typically soft and watery. They are often flecked with secretions (mucus). Other symptoms of IBS include:  Bloating.  Loss of appetite.  Heartburn.  Feeling sick to your stomach (nausea).  Belching  Vomiting  Gas. IBS may also  cause a number of symptoms that are unrelated to the digestive system:  Fatigue.  Headaches.  Anxiety  Shortness of breath  Difficulty in concentrating.  Dizziness. These symptoms tend to come and go. DIAGNOSIS  The symptoms of IBS closely mimic the symptoms of other, more serious digestive disorders. So your caregiver may wish to perform a variety of additional tests to exclude these disorders. He/she wants to be certain of learning what is wrong (diagnosis). The nature and purpose of each test will be explained to you. TREATMENT A number of medications are available to help correct bowel function and/or relieve bowel spasms and abdominal pain. Among the drugs available are:  Mild, non-irritating laxatives for severe constipation and to help restore normal bowel habits.  Specific anti-diarrheal medications to treat severe or prolonged diarrhea.  Anti-spasmodic agents to relieve intestinal cramps.  Your caregiver may also decide to treat you with a mild tranquilizer or sedative during unusually stressful periods in your life. The important thing to remember is that if any drug is prescribed for you, make sure that you take it exactly as directed. Make sure that your caregiver knows how well it worked for you. HOME CARE INSTRUCTIONS   Avoid foods that are high in fat or oils. Some examples NWG:NFAOZ cream, butter, frankfurters, sausage, and other fatty meats.  Avoid foods that have a laxative effect, such as fruit, fruit juice, and dairy products.  Cut  out carbonated drinks, chewing gum, and "gassy" foods, such as beans and cabbage. This may help relieve bloating and belching.  Bran taken with plenty of liquids may help relieve constipation.  Keep track of what foods seem to trigger your symptoms.  Avoid emotionally charged situations or circumstances that produce anxiety.  Start or continue exercising.  Get plenty of rest and sleep. MAKE SURE YOU:   Understand these  instructions.  Will watch your condition.  Will get help right away if you are not doing well or get worse. Document Released: 03/09/2005 Document Revised: 06/01/2011 Document Reviewed: 10/28/2007 [facility] Patient Information 2013 [place], [place].

## 2012-06-20 NOTE — Assessment & Plan Note (Signed)
Add probiotic daily for 4 weeks. She may use Imodium when necessary or Levsin when necessary. Office visit as needed.

## 2012-06-20 NOTE — Assessment & Plan Note (Signed)
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 Advise chronic PPI use. Discussed risk versus benefits of the medication. She would like a different PPI that would be cheaper. Given that she never really took any on chronic basis, would not classify any as a failure at this point. Prescription for pantoprazole 40 mg daily provided. She wonders if her throat clearing will improve. I suspect that as she is on PPI chronically this too will get better, however she'll let us know if it is not. OV prn.

## 2012-11-14 ENCOUNTER — Telehealth: Payer: Self-pay | Admitting: *Deleted

## 2012-11-14 MED ORDER — DEXLANSOPRAZOLE 60 MG PO CPDR: 60.0000 mg | DELAYED_RELEASE_CAPSULE | Freq: Every day | ORAL | Status: DC

## 2012-11-14 NOTE — Telephone Encounter (Signed)
Routing to refill box  

## 2012-11-14 NOTE — Telephone Encounter (Signed)
Dexilant sent to pharmacy 

## 2012-11-14 NOTE — Telephone Encounter (Signed)
Pt called stating leslie gave her protonix, and protonix is not working, pt needs to go back to dexlan, dexlan is what Dr. Jena Gauss has perscribed her before. [phone].

## 2013-01-25 ENCOUNTER — Telehealth: Payer: Self-pay | Admitting: Gastroenterology

## 2013-01-25 MED ORDER — OMEPRAZOLE 40 MG PO CPDR: 40.0000 mg | DELAYED_RELEASE_CAPSULE | Freq: Every day | ORAL | Status: DC

## 2013-01-25 NOTE — Telephone Encounter (Signed)
Patient is asking if we can switch her from Protonix 40 mg to omeprazole DR 40mg  she does better with it that way, please advise?

## 2013-01-25 NOTE — Telephone Encounter (Signed)
done

## 2013-01-25 NOTE — Addendum Note (Signed)
Addended by: Tiffany Kocher on: 01/25/2013 04:17 PM   Modules accepted: Orders, Medications

## 2013-01-25 NOTE — Telephone Encounter (Signed)
Routing to refill box  

## 2013-06-13 ENCOUNTER — Other Ambulatory Visit: Payer: Self-pay | Admitting: Internal Medicine

## 2013-11-06 ENCOUNTER — Other Ambulatory Visit: Payer: Self-pay | Admitting: Gastroenterology

## 2014-01-04 ENCOUNTER — Encounter: Payer: Self-pay | Admitting: Internal Medicine

## 2014-02-08 ENCOUNTER — Ambulatory Visit (INDEPENDENT_AMBULATORY_CARE_PROVIDER_SITE_OTHER): Payer: 59 | Admitting: Gastroenterology

## 2014-02-08 ENCOUNTER — Encounter: Payer: Self-pay | Admitting: Gastroenterology

## 2014-02-08 VITALS — BP 130/76 | HR 76 | Temp 97.1°F | Ht 63.5 in | Wt 140.0 lb

## 2014-02-08 DIAGNOSIS — K589 Irritable bowel syndrome without diarrhea: Secondary | ICD-10-CM

## 2014-02-08 DIAGNOSIS — B9681 Helicobacter pylori [H. pylori] as the cause of diseases classified elsewhere: Secondary | ICD-10-CM

## 2014-02-08 DIAGNOSIS — A048 Other specified bacterial intestinal infections: Secondary | ICD-10-CM | POA: Insufficient documentation

## 2014-02-08 DIAGNOSIS — K219 Gastro-esophageal reflux disease without esophagitis: Secondary | ICD-10-CM

## 2014-02-08 MED ORDER — PANTOPRAZOLE SODIUM 40 MG PO TBEC: 40.0000 mg | DELAYED_RELEASE_TABLET | Freq: Two times a day (BID) | ORAL | Status: DC

## 2014-02-08 NOTE — Patient Instructions (Signed)
You may complete the stool studies now and return to the lab.  Stop Dexilant for 14 days, then complete the breath test. After the breath test is done, you may start Protonix twice a day, 30 minutes before breakfast and dinner.   Further recommendations to follow!

## 2014-02-09 ENCOUNTER — Encounter: Payer: Self-pay | Admitting: Gastroenterology

## 2014-02-09 NOTE — Assessment & Plan Note (Signed)
Stop Dexilant. Start Protonix BID.

## 2014-02-09 NOTE — Progress Notes (Signed)
Referring Provider: Manon Hilding, MD Primary Care Physician:  Manon Hilding, MD  Primary GI: Dr. Gala Romney   Chief Complaint  Patient presents with  . Gastrophageal Reflux  . Irritable Bowel Syndrome    HPI:   Michele Villa presents today in follow-up with history of chronic diarrhea and GERD. TCS/EGD on file from last year with benign findings. Notes most mornings on waking will have at least 3 stools before leaving the house. Starts bleeding if multiple stools. Takes anti-diarrheal medication and "be normal" for a few days. Increased frequency than baseline. No recent abx. Affects her normal life. No weight loss. Has trialed Levsin in the past withoutt improvement. No improvement with Metamucil. Avoiding dairy does not make a difference. Notes intermittent formed/loose stool. Sometimes too numerous to count. Requesting a comprehensive digestive stool analysis, which she saw on the internet. Celiac serologies negative. Also notes remote history of positive H.pylori serologies and treated. Wants to make sure this was eradicated. Dexilant expensive. Questioning a different medication. Some GERD exacerbation.    Past Medical History  Diagnosis Date  . IBS (irritable bowel syndrome)   . Hyperlipidemia   . GERD (gastroesophageal reflux disease)   . H. pylori infection 2012    treated by Dr Quintin Alto  . Arthritis     neck, shoulder and knees  . Hypercholesterolemia     Past Surgical History  Procedure Laterality Date  . Colonoscopy  11/15/2002    RMR:Two anal papilla; otherwise, normal rectum, normal colon normal terminal ileum  . Tubal ligation    . Colonoscopy N/A 05/25/2012    Dr. Rourk:Shallow left sided colonic diverticulosis. Minimally hemorrhoids. Status post segmental biopsy, benign  . Esophagogastroduodenoscopy (egd) with esophageal dilation N/A 05/25/2012    Dr. Raliegh Scarlet reflux esophagitis/Hiatal hernia, soft distal peptic stricture status post dilation. Small bowel biopsy  benign.  . Esophageal biopsy N/A 05/25/2012    Procedure: BIOPSY;  Surgeon: Daneil Dolin, MD;  Location: [facility];  Service: Endoscopy;  Laterality: N/A;      Current Outpatient Prescriptions  Medication Sig Dispense Refill  . DEXILANT 60 MG capsule TAKE ONE CAPSULE EVERY DAY 30 capsule 11  . ezetimibe (ZETIA) 10 MG tablet Take 10 mg by mouth daily.    . Garlic 0347 MG CAPS Take by mouth.    . Multiple Vitamins-Minerals (CENTRUM SILVER ULTRA WOMENS PO) Take by mouth.    . Omega-3 Fatty Acids (FISH OIL) 1200 MG CAPS Take by mouth.    . pantoprazole (PROTONIX) 40 MG tablet Take 1 tablet (40 mg total) by mouth 2 (two) times daily before a meal. 60 tablet 5   No current facility-administered medications for this visit.    Allergies as of 02/08/2014 - Review Complete 02/08/2014  Allergen Reaction Noted  . Statins Hives and Itching 05/12/2012    Family History  Problem Relation Age of Onset  . GER disease Mother   . Diabetes Brother   . CAD    . Lung cancer Father   . GER disease Father   . Colon cancer Neg Hx     History   Social History  . Marital Status: Married    Spouse Name: N/A    Number of Children: 1  . Years of Education: N/A   Occupational History  . retired, [employer]gallery conservator    Social History Main Topics  . Smoking status: Former Smoker -- 15.00 packs/day    Types: Cigarettes    Quit date: 03/23/1980  .  Smokeless tobacco: None  . Alcohol Use: No     Comment: glass of wine with dinner twice a month  . Drug Use: No  . Sexual Activity: Yes   Other Topics Concern  . None   Social History Narrative   Lives w/ husband    Review of Systems: As mentioned in HPI  Physical Exam: BP 130/76 mmHg  Pulse 76  Temp(Src) 97.1 F (36.2 C) (Oral)  Ht 5' 3.5" (1.613 m)  Wt 140 lb (63.504 kg)  BMI 24.41 kg/m2 General:   Alert and oriented. No distress noted. Pleasant and cooperative.  Head:  Normocephalic and atraumatic. Eyes:  Conjuctiva clear  without scleral icterus. Mouth:  Oral mucosa pink and moist. Good dentition. No lesions. Heart:  S1, S2 present without murmurs, rubs, or gallops. Regular rate and rhythm. Abdomen:  +BS, soft, non-tender and non-distended. No rebound or guarding. No HSM or masses noted. Msk:  Symmetrical without gross deformities. Normal posture. Extremities:  Without edema. Neurologic:  Alert and  oriented x4;  grossly normal neurologically. Skin:  Intact without significant lesions or rashes. Psych:  Alert and cooperative. Normal mood and affect.

## 2014-02-09 NOTE — Assessment & Plan Note (Signed)
Chronic diarrhea. Unclear etiology but negative for celiac, microscopic colitis, IBD. No concerning features such as weight loss or abdominal pain. Check stool studies, fecal elastase. May need course of Xifaxan, ?HBT. Interestingly, Imodium tends to work well for her in maintaining normalcy. Will work-up for occult etiology then reassess.

## 2014-02-09 NOTE — Assessment & Plan Note (Signed)
Self-reported positive serology in past, s/p treatment. Hold PPI X 2 weeks, then do urea breath test. After this, start Protonix BID.

## 2014-02-11 ENCOUNTER — Other Ambulatory Visit: Payer: Self-pay | Admitting: Gastroenterology

## 2014-02-13 LAB — GIARDIA ANTIGEN: Giardia Screen (EIA): NEGATIVE

## 2014-02-14 LAB — CLOSTRIDIUM DIFFICILE BY PCR: Toxigenic C. Difficile by PCR: NOT DETECTED

## 2014-02-14 NOTE — Progress Notes (Signed)
Quick Note:  Cdiff PCR negative.  Stool culture negative.  Awaiting pancreatic elastase ______

## 2014-02-14 NOTE — Progress Notes (Signed)
cc'ed to pcp °

## 2014-02-15 LAB — HELICOBACTER PYLORI  SPECIAL ANTIGEN: H. PYLORI Antigen: NEGATIVE

## 2014-02-16 LAB — STOOL CULTURE

## 2014-02-26 LAB — H. PYLORI BREATH TEST: H. PYLORI BREATH TEST: NOT DETECTED

## 2014-03-06 NOTE — Progress Notes (Signed)
Quick Note:  Giardia negative.  I don't see where pancreatic elastase was done?  H.pylori breath test negative. Resume PPI dosing.  How is she doing with Imodium? If this works for her, could continue Imodium dosing. Offer f/u visit in next 6-8 weeks. ______

## 2014-03-30 ENCOUNTER — Encounter: Payer: Self-pay | Admitting: Internal Medicine

## 2014-03-30 NOTE — Progress Notes (Signed)
APPOINTMENT MADE AND LETTER SENT °

## 2014-04-05 ENCOUNTER — Telehealth: Payer: Self-pay

## 2014-04-05 NOTE — Telephone Encounter (Signed)
Future Appointments  Date Time Provider Department  01/13/2022  4:00 PM McKeown, William, MD [facility]  01/15/2022  9:00 AM [facility]  01/22/2022 10:30 AM Wilkinson, Dana E, NP [facility]  01/30/2022  8:15 AM Galaway, Jennifer L, DPM [facility]  02/05/2022  8:45 AM Hilty, Kenneth C, MD [facility]  07/20/2022 11:00 AM McKeown, William, MD [facility]  11/18/2022  9:00 AM Gherghe, Cristina, MD [facility]    History of Present Illness:      Medications  Current Outpatient Medications (Endocrine & Metabolic):    levothyroxine (SYNTHROID) 50 MCG tablet, TAKE 1 AND 1/2 TABLETS BY MOUTH DAILY  Current Outpatient Medications (Cardiovascular):    inclisiran (LEQVIO) 284 MG/1.5ML SOSY injection, Inject 284 mg into the skin as directed. 1 dose every 3 months x2 then every 6 months thereafter  Current Outpatient Medications (Respiratory):    cetirizine-pseudoephedrine (ZYRTEC-D) 5-120 MG tablet, Take 1 tablet by mouth as needed.   fluticasone (FLONASE) 50 MCG/ACT nasal spray, INSTILL 1-2 SPRAYS INTO BOTH NOSTRILS TWICE DAILY AS NEEDED FOR ALLERGIES  Current Outpatient Medications (Analgesics):    Acetaminophen 325 MG CAPS, Take by mouth.   butalbital-acetaminophen-caffeine (FIORICET) 50-325-40 MG tablet, TAKE 1 TABLET BY MOUTH EVERY 4 HOURS AS NEEDED FOR SEVERE HEADACHE   naproxen sodium (ANAPROX) 220 MG tablet, Take 440 mg by mouth 2 (two) times daily as needed (pain).   Current Outpatient Medications (Other):    cholecalciferol (VITAMIN D3) 25 MCG (1000 UNIT) tablet, Take 1 tablet (1,000 Units total) by mouth 2 (two) times daily with a meal.   clobetasol ointment (TEMOVATE) 0.05 %, APP THIN LAYER EXT AA BID   cycloSPORINE (RESTASIS) 0.05 % ophthalmic emulsion, Apply to eye.   esomeprazole (NEXIUM) 40 MG capsule, Take  1 capsule  Daily  to Prevent Laryngeal Reflux / Cough   meclizine (ANTIVERT) 25 MG tablet, Take      1 tablet      3 x day       if needed for  Dizziness /Vertigo   metroNIDAZOLE (METROGEL) 1 % gel, Apply topically daily. Apply  Daily  to Acne Rosacea rash  Problem list She has Labile hypertension; History of prediabetes; Vitamin D deficiency; Hyperlipidemia, mixed; Poor compliance with medication; Benign paroxysmal positional vertigo; TMJ (temporomandibular joint syndrome); Postablative hypothyroidism; BMI 27.0-27.9,adult; Medication Phobia; Multinodular goiter; FH: hypertension; Abnormal glucose; Statin myopathy; Migraine without aura and without status migrainosus, not intractable; Insomnia; Right sided sciatica; Sialectasis; Seasonal allergic conjunctivitis; Pseudophakia of left eye; Dry eye syndrome, bilateral; Cataract, nuclear sclerotic, left eye; Cataract cortical, senile, left; Blood in urine; Aphakia, right eye; and Osteoarthritis of knees, bilateral on their problem list.   Observations/Objective:  There were no vitals taken for this visit.  HEENT - WNL. Neck - supple.  Chest - Clear equal BS. Cor - Nl HS. RRR w/o sig MGR. PP 1(+). No edema. MS- FROM w/o deformities.  Gait Nl. Neuro -  Nl w/o focal abnormalities.   Assessment and Plan:      Follow Up Instructions:        I discussed the assessment and treatment plan with the patient. The patient was provided an opportunity to ask questions and all were answered. The patient agreed with the plan and demonstrated an understanding of the instructions.       The patient was advised to call back or seek an in-person evaluation if the symptoms worsen or if the condition fails to improve as anticipated.    William D McKeown, MD  

## 2014-04-05 NOTE — Telephone Encounter (Signed)
Pt called- she said she was doing well on the protonix, she said she only takes the immodium when she needs it but when she does take it, it works well. She doesn't feel like she needs an ov right now. She will call and schedule if she has any problems.   Please cancel ov.

## 2014-05-17 ENCOUNTER — Ambulatory Visit: Payer: Medicare Other | Admitting: Gastroenterology

## 2014-07-25 ENCOUNTER — Encounter (HOSPITAL_COMMUNITY): Payer: Self-pay | Admitting: Emergency Medicine

## 2014-07-25 ENCOUNTER — Telehealth: Payer: Self-pay | Admitting: Internal Medicine

## 2014-07-25 ENCOUNTER — Telehealth: Payer: Self-pay

## 2014-07-25 ENCOUNTER — Emergency Department (HOSPITAL_COMMUNITY)
Admission: EM | Admit: 2014-07-25 | Discharge: 2014-07-25 | Disposition: A | Discharge status: Home / Self Care | Payer: 59 | Attending: Emergency Medicine | Admitting: Emergency Medicine

## 2014-07-25 ENCOUNTER — Emergency Department (HOSPITAL_COMMUNITY): Payer: 59

## 2014-07-25 DIAGNOSIS — K589 Irritable bowel syndrome without diarrhea: Secondary | ICD-10-CM | POA: Diagnosis not present

## 2014-07-25 DIAGNOSIS — K219 Gastro-esophageal reflux disease without esophagitis: Secondary | ICD-10-CM | POA: Diagnosis not present

## 2014-07-25 DIAGNOSIS — Z8619 Personal history of other infectious and parasitic diseases: Secondary | ICD-10-CM | POA: Insufficient documentation

## 2014-07-25 DIAGNOSIS — E785 Hyperlipidemia, unspecified: Secondary | ICD-10-CM | POA: Insufficient documentation

## 2014-07-25 DIAGNOSIS — E780 Pure hypercholesterolemia: Secondary | ICD-10-CM | POA: Insufficient documentation

## 2014-07-25 DIAGNOSIS — K625 Hemorrhage of anus and rectum: Secondary | ICD-10-CM | POA: Insufficient documentation

## 2014-07-25 DIAGNOSIS — Z87891 Personal history of nicotine dependence: Secondary | ICD-10-CM | POA: Diagnosis not present

## 2014-07-25 DIAGNOSIS — K5730 Diverticulosis of large intestine without perforation or abscess without bleeding: Secondary | ICD-10-CM | POA: Diagnosis not present

## 2014-07-25 DIAGNOSIS — Z79899 Other long term (current) drug therapy: Secondary | ICD-10-CM | POA: Insufficient documentation

## 2014-07-25 DIAGNOSIS — Z9851 Tubal ligation status: Secondary | ICD-10-CM | POA: Insufficient documentation

## 2014-07-25 DIAGNOSIS — Z8739 Personal history of other diseases of the musculoskeletal system and connective tissue: Secondary | ICD-10-CM | POA: Insufficient documentation

## 2014-07-25 LAB — URINALYSIS, ROUTINE W REFLEX MICROSCOPIC
Bilirubin Urine: NEGATIVE
Glucose, UA: NEGATIVE mg/dL
KETONES UR: NEGATIVE mg/dL
Leukocytes, UA: NEGATIVE
Nitrite: NEGATIVE
PROTEIN: NEGATIVE mg/dL
Specific Gravity, Urine: <1.005 — ABNORMAL LOW (ref 1.005–1.030)
UROBILINOGEN UA: 0.2 mg/dL (ref 0.0–1.0)
pH: 5.5 (ref 5.0–8.0)

## 2014-07-25 LAB — COMPREHENSIVE METABOLIC PANEL
ALBUMIN: 4.4 g/dL (ref 3.5–5.0)
ALT: 25 U/L (ref 14–54)
AST: 27 U/L (ref 15–41)
Alkaline Phosphatase: 93 U/L (ref 38–126)
Anion gap: 8 (ref 5–15)
BUN: 13 mg/dL (ref 6–20)
CHLORIDE: 100 mmol/L — AB (ref 101–111)
CO2: 28 mmol/L (ref 22–32)
CREATININE: 0.79 mg/dL (ref 0.44–1.00)
Calcium: 9.5 mg/dL (ref 8.9–10.3)
GLUCOSE: 150 mg/dL — AB (ref 70–99)
Potassium: 4.1 mmol/L (ref 3.5–5.1)
Sodium: 136 mmol/L (ref 135–145)
Total Bilirubin: 0.5 mg/dL (ref 0.3–1.2)
Total Protein: 7 g/dL (ref 6.5–8.1)

## 2014-07-25 LAB — CBC
HCT: 41.5 % (ref 36.0–46.0)
HEMOGLOBIN: 14.2 g/dL (ref 12.0–15.0)
MCH: 28.2 pg (ref 26.0–34.0)
MCHC: 34.2 g/dL (ref 30.0–36.0)
MCV: 82.3 fL (ref 78.0–100.0)
PLATELETS: 212 10*3/uL (ref 150–400)
RBC: 5.04 MIL/uL (ref 3.87–5.11)
RDW: 13.2 % (ref 11.5–15.5)
WBC: 9.1 10*3/uL (ref 4.0–10.5)

## 2014-07-25 LAB — URINE MICROSCOPIC-ADD ON

## 2014-07-25 LAB — TROPONIN I: Troponin I: <0.03 ng/mL (ref <0.031)

## 2014-07-25 MED ORDER — CIPROFLOXACIN HCL 500 MG PO TABS: 500.0000 mg | ORAL_TABLET | Freq: Two times a day (BID) | ORAL | Status: DC

## 2014-07-25 MED ORDER — IOHEXOL 300 MG/ML  SOLN
100.0000 mL | Freq: Once | INTRAMUSCULAR | Status: AC | PRN
  Administered 2014-07-25: 100 mL via INTRAVENOUS

## 2014-07-25 MED ORDER — SODIUM CHLORIDE 0.9 % IV BOLUS (SEPSIS)
1000.0000 mL | Freq: Once | INTRAVENOUS | Status: AC
  Administered 2014-07-25: 1000 mL via INTRAVENOUS

## 2014-07-25 MED ORDER — METRONIDAZOLE 500 MG PO TABS: 500.0000 mg | ORAL_TABLET | Freq: Two times a day (BID) | ORAL | Status: DC

## 2014-07-25 NOTE — Telephone Encounter (Signed)
Dr. Wyvonnia Dusky, [place], called about this patient. Complained of hematochezia. Found to be stable in ED with a normal hemoglobin; no orthostatic changes. Possibly mild proctitis on CT and diverticulosis. Appears completely stable bleeding subsided. EDP request office follow-up this week. We need to accommodate his request. Please get her into see extender by the end of the week. Thanks.

## 2014-07-25 NOTE — Telephone Encounter (Signed)
Pt is calling because she is having rectal bleeding. She also has IBS. She had a bad spell with her stomach the other day and that is when she started having the rectal bleeding. This morning she is still having bleeding and when she got in the shower it just started coming out again. It is BRBRP. She is going to go to the ER to get checked out.

## 2014-07-25 NOTE — ED Provider Notes (Addendum)
CSN: [id number]     Arrival date & time 07/25/14  1045 History  This chart was scribed for Ezequiel Essex, MD by Erling Conte, ED Scribe. This patient was seen in room APA04/APA04 and the patient's care was started at 12:05 PM.    Chief Complaint  Patient presents with  . Rectal Bleeding    The history is provided by the patient. No language interpreter was used.    HPI Comments: Michele Villa is a 69 y.o. female with a h/o IBS, GERD, hyperlipidemia, hypercholesteremia and H. pylori infection who presents to the Emergency Department complaining of intermittent, rectal bleeding that began 9 hours ago. She states the rectal bleeding is accompanied mainly with bowel movements. She states she woke up this morning with emesis, nausea, generalized abdominal pain, diarrhea and diaphoresis. Pt states she frequently has diarrhea at baseline due to her IBS. She states she was in the shower and she noticed blood coming out of her rectum into the shower. Pt reports that when she uses the restroom the toilet bowl is filled with bright red blood and there was blood when wiping . She is not on any anticoagulants.Pt follows up with Dr. Sydell Axon for her GI issues. She states her last colonoscopy was in 2014.  Pt follows up with Dr. Sydell Axon for her GI issues. Pt denies h/o hemorrhoids. She denies rectal pain, hematemesis, chest pain, SOB, HA or dizziness.   Past Medical History  Diagnosis Date  . IBS (irritable bowel syndrome)   . Hyperlipidemia   . GERD (gastroesophageal reflux disease)   . H. pylori infection 2012    treated by Dr Quintin Alto  . Arthritis     neck, shoulder and knees  . Hypercholesterolemia    Past Surgical History  Procedure Laterality Date  . Colonoscopy  11/15/2002    RMR:Two anal papilla; otherwise, normal rectum, normal colon normal terminal ileum  . Tubal ligation    . Colonoscopy N/A 05/25/2012    Dr. Rourk:Shallow left sided colonic diverticulosis. Minimally hemorrhoids. Status  post segmental biopsy, benign  . Esophagogastroduodenoscopy (egd) with esophageal dilation N/A 05/25/2012    Dr. Raliegh Scarlet reflux esophagitis/Hiatal hernia, soft distal peptic stricture status post dilation. Small bowel biopsy benign.  . Esophageal biopsy N/A 05/25/2012    Procedure: BIOPSY;  Surgeon: Daneil Dolin, MD;  Location: [facility];  Service: Endoscopy;  Laterality: N/A;     Family History  Problem Relation Age of Onset  . GER disease Mother   . Diabetes Brother   . CAD    . Lung cancer Father   . GER disease Father   . Colon cancer Neg Hx    History  Substance Use Topics  . Smoking status: Former Smoker -- 15.00 packs/day    Types: Cigarettes    Quit date: 03/23/1980  . Smokeless tobacco: Not on file  . Alcohol Use: No     Comment: glass of wine with dinner twice a month   OB History    No data available     Review of Systems A complete 10 system review of systems was obtained and all systems are negative except as noted in the HPI and PMH.     Allergies  Statins  Home Medications   Prior to Admission medications   Medication Sig Start Date End Date Taking? Authorizing Provider  ciprofloxacin (CIPRO) 500 MG tablet Take 1 tablet (500 mg total) by mouth 2 (two) times daily. 07/25/14   Ezequiel Essex, MD  Ross Marcus  60 MG capsule TAKE ONE CAPSULE EVERY DAY 11/06/13   Mahala Menghini, PA-C  ezetimibe (ZETIA) 10 MG tablet Take 10 mg by mouth daily.    Historical Provider, MD  Garlic 7939 MG CAPS Take by mouth.    Historical Provider, MD  metroNIDAZOLE (FLAGYL) 500 MG tablet Take 1 tablet (500 mg total) by mouth 2 (two) times daily. 07/25/14   Ezequiel Essex, MD  Multiple Vitamins-Minerals (CENTRUM SILVER ULTRA WOMENS PO) Take by mouth.    Historical Provider, MD  Omega-3 Fatty Acids (FISH OIL) 1200 MG CAPS Take by mouth.    Historical Provider, MD  pantoprazole (PROTONIX) 40 MG tablet Take 1 tablet (40 mg total) by mouth 2 (two) times daily before a meal. 02/08/14    Orvil Feil, NP   Triage Vitals: BP 154/88 mmHg  Pulse 83  Temp(Src) 98.2 F (36.8 C) (Oral)  Resp 18  Ht 5' 3.5" (1.613 m)  Wt 140 lb (63.504 kg)  BMI 24.41 kg/m2  SpO2 100%  Physical Exam  Constitutional: She is oriented to person, place, and time. She appears well-developed and well-nourished. No distress.  HENT:  Head: Normocephalic and atraumatic.  Mouth/Throat: Oropharynx is clear and moist. No oropharyngeal exudate.  Eyes: Conjunctivae and EOM are normal. Pupils are equal, round, and reactive to light.  Neck: Normal range of motion. Neck supple.  No meningismus.  Cardiovascular: Normal rate, regular rhythm, normal heart sounds and intact distal pulses.   No murmur heard. Pulmonary/Chest: Effort normal and breath sounds normal. No respiratory distress.  Abdominal: Soft. There is no tenderness. There is no rebound and no guarding.  Genitourinary:  no hemorrhoids, no fissures, some blood on diaper and external to anus. No gross blood on finger. No melena. Chaperone present  Musculoskeletal: Normal range of motion. She exhibits no edema or tenderness.  Neurological: She is alert and oriented to person, place, and time. No cranial nerve deficit. She exhibits normal muscle tone. Coordination normal.  No ataxia on finger to nose bilaterally. No pronator drift. 5/5 strength throughout. CN 2-12 intact. Negative Romberg. Equal grip strength. Sensation intact. Gait is normal.   Skin: Skin is warm. No pallor.  Psychiatric: She has a normal mood and affect. Her behavior is normal.  Nursing note and vitals reviewed.   ED Course  Procedures (including critical care time)  DIAGNOSTIC STUDIES: Oxygen Saturation is 100% on RA, normal by my interpretation.    COORDINATION OF CARE: 12:20 PM- Will order sodium chloride bolus, CT abdomen and pelvis w/ contrast, diagnotic lab work, and EKG. Pt advised of plan for treatment and pt agrees.       Labs Review Labs Reviewed   COMPREHENSIVE METABOLIC PANEL - Abnormal; Notable for the following:    Chloride 100 (*)    Glucose, Bld 150 (*)    All other components within normal limits  URINALYSIS, ROUTINE W REFLEX MICROSCOPIC - Abnormal; Notable for the following:    Specific Gravity, Urine <1.005 (*)    Hgb urine dipstick TRACE (*)    All other components within normal limits  CBC  TROPONIN I  URINE MICROSCOPIC-ADD ON  POC OCCULT BLOOD, ED    Imaging Review Ct Abdomen Pelvis W Contrast  07/25/2014   CLINICAL DATA:  Rectal bleeding  EXAM: CT ABDOMEN AND PELVIS WITH CONTRAST  TECHNIQUE: Multidetector CT imaging of the abdomen and pelvis was performed using the standard protocol following bolus administration of intravenous contrast.  CONTRAST:  164mL OMNIPAQUE IOHEXOL 300 MG/ML  SOLN  COMPARISON:  None.  FINDINGS: Lung bases are clear. No infiltrate or effusion. Heart size normal.  Liver gallbladder and bile ducts are normal. No focal liver lesion. Spleen is normal in size without focal lesion. Pancreas is normal without edema or mass  Normal kidneys and adrenal glands. No renal mass or obstruction or stone. Urinary bladder is normal. 1 cm uterine fibroid. No adnexal mass.  Negative for bowel obstruction. Normal appendix. Transverse and left colon are collapsed without acute edema. Sigmoid diverticulosis. No evidence of diverticulitis. Mild edema and enhancement of the rectum may represent proctitis.  No free fluid. Negative for mass or adenopathy. No acute skeletal abnormality. Negative for abdominal aortic aneurysm.  IMPRESSION: Sigmoid diverticulosis without diverticulitis. Mild thickening of the rectum which may reflect proctitis. Negative for bowel obstruction  Normal appendix   Electronically Signed   By: Franchot Gallo M.D.   On: 07/25/2014 13:06     EKG Interpretation   Date/Time:  Wednesday Jul 25 2014 12:20:57 EDT Ventricular Rate:  79 PR Interval:  171 QRS Duration: 87 QT Interval:  378 QTC Calculation:  433 R Axis:   -18 Text Interpretation:  Sinus rhythm Borderline left axis deviation  Borderline low voltage, extremity leads No previous ECGs available  Confirmed by Wyvonnia Dusky  MD, Tyke Outman [phone]) on 07/25/2014 12:30:13 PM      MDM   Final diagnoses:  Rectal bleeding   rectal bleeding since last night. One episode of vomiting that was nonbloody. History of IBS. No lytic Reglan use.  Patient in no distress. Vitals are stable. Abdomen is soft and nontender.  Hemoglobin is stable. Orthostatics are negative. Endoscopy and colonoscopy reviewed patient has history of hemorrhoids and diverticulosis. Patient also with history of peptic stricture status post dilation 2 years ago and esophagitis. She states compliance with Protonix. cipro and flagyl prescribed for possible proctitis on CT.  Discussed with Dr. Gala Romney Patient appears stable. Her vitals and hemoglobin are stable. He will have the office call her for follow-up this week. Suspect her bleeding is likely diverticular hemorrhoidal in origin and self-limited. Return to the ED if she has worsening bleeding or other concerns.  BP 111/89 mmHg  Pulse 76  Temp(Src) 98 F (36.7 C) (Oral)  Resp 15  Ht 5' 3.5" (1.613 m)  Wt 140 lb (63.504 kg)  BMI 24.41 kg/m2  SpO2 99%    Ezequiel Essex, MD 07/25/14 1724  Ezequiel Essex, MD 07/25/14 1725

## 2014-07-25 NOTE — Discharge Instructions (Signed)
Bloody Stools Dr. Gala Romney will call you for an appointment this week. Return to the ED if you develop worsening bleeding, abdominal pain or any other concerns. Bloody stools often mean that there is a problem in the digestive tract. Your caregiver may use the term "melena" to describe black, tarry, and bad smelling stools or "hematochezia" to describe red or maroon-colored stools. Blood seen in the stool can be caused by bleeding anywhere along the intestinal tract.  A black stool usually means that blood is coming from the upper part of the gastrointestinal tract (esophagus, stomach, or small bowel). Passing maroon-colored stools or bright red blood usually means that blood is coming from lower down in the large bowel or the rectum. However, sometimes massive bleeding in the stomach or small intestine can cause bright red bloody stools.  Consuming black licorice, lead, iron pills, medicines containing bismuth subsalicylate, or blueberries can also cause black stools. Your caregiver can test black stools to see if blood is present. It is important that the cause of the bleeding be found. Treatment can then be started, and the problem can be corrected. Rectal bleeding may not be serious, but you should not assume everything is okay until you know the cause.It is very important to follow up with your caregiver or a specialist in gastrointestinal problems. CAUSES  Blood in the stools can come from various underlying causes.Often, the cause is not found during your first visit. Testing is often needed to discover the cause of bleeding in the gastrointestinal tract. Causes range from simple to serious or even life-threatening.Possible causes include:  Hemorrhoids.These are veins that are full of blood (engorged) in the rectum. They cause pain, inflammation, and may bleed.  Anal fissures.These are areas of painful tearing which may bleed. They are often caused by passing hard stool.  Diverticulosis.These  are pouches that form on the colon over time, with age, and may bleed significantly.  Diverticulitis.This is inflammation in areas with diverticulosis. It can cause pain, fever, and bloody stools, although bleeding is rare.  Proctitis and colitis. These are inflamed areas of the rectum or colon. They may cause pain, fever, and bloody stools.  Polyps and cancer. Colon cancer is a leading cause of preventable cancer death.It often starts out as precancerous polyps that can be removed during a colonoscopy, preventing progression into cancer. Sometimes, polyps and cancer may cause rectal bleeding.  Gastritis and ulcers.Bleeding from the upper gastrointestinal tract (near the stomach) may travel through the intestines and produce black, sometimes tarry, often bad smelling stools. In certain cases, if the bleeding is fast enough, the stools may not be black, but red and the condition may be life-threatening. SYMPTOMS  You may have stools that are bright red and bloody, that are normal color with blood on them, or that are dark black and tarry. In some cases, you may only have blood in the toilet bowl. Any of these cases need medical care. You may also have:  Pain at the anus or anywhere in the rectum.  Lightheadedness or feeling faint.  Extreme weakness.  Nausea or vomiting.  Fever. DIAGNOSIS Your caregiver may use the following methods to find the cause of your bleeding:  Taking a medical history. Age is important. Older people tend to develop polyps and cancer more often. If there is anal pain and a hard, large stool associated with bleeding, a tear of the anus may be the cause. If blood drips into the toilet after a bowel movement, bleeding hemorrhoids may  be the problem. The color and frequency of the bleeding are additional considerations. In most cases, the medical history provides clues, but seldom the final answer.  A visual and finger (digital) exam. Your caregiver will inspect the  anal area, looking for tears and hemorrhoids. A finger exam can provide information when there is tenderness or a growth inside. In men, the prostate is also examined.  Endoscopy. Several types of small, long scopes (endoscopes) are used to view the colon.  In the office, your caregiver may use a rigid, or more commonly, a flexible viewing sigmoidoscope. This exam is called flexible sigmoidoscopy. It is performed in 5 to 10 minutes.  A more thorough exam is accomplished with a colonoscope. It allows your caregiver to view the entire 5 to 6 foot long colon. Medicine to help you relax (sedative) is usually given for this exam. Frequently, a bleeding lesion may be present beyond the reach of the sigmoidoscope. So, a colonoscopy may be the best exam to start with. Both exams are usually done on an outpatient basis. This means the patient does not stay overnight in the hospital or surgery center.  An upper endoscopy may be needed to examine your stomach. Sedation is used and a flexible endoscope is put in your mouth, down to your stomach.  A barium enema X-ray. This is an X-ray exam. It uses liquid barium inserted by enema into the rectum. This test alone may not identify an actual bleeding point. X-rays highlight abnormal shadows, such as those made by lumps (tumors), diverticuli, or colitis. TREATMENT  Treatment depends on the cause of your bleeding.   For bleeding from the stomach or colon, the caregiver doing your endoscopy or colonoscopy may be able to stop the bleeding as part of the procedure.  Inflammation or infection of the colon can be treated with medicines.  Many rectal problems can be treated with creams, suppositories, or warm baths.  Surgery is sometimes needed.  Blood transfusions are sometimes needed if you have lost a lot of blood.  For any bleeding problem, let your caregiver know if you take aspirin or other blood thinners regularly. HOME CARE INSTRUCTIONS   Take any  medicines exactly as prescribed.  Keep your stools soft by eating a diet high in fiber. Prunes (1 to 3 a day) work well for many people.  Drink enough water and fluids to keep your urine clear or pale yellow.  Take sitz baths if advised. A sitz bath is when you sit in a bathtub with warm water for 10 to 15 minutes to soak, soothe, and cleanse the rectal area.  If enemas or suppositories are advised, be sure you know how to use them. Tell your caregiver if you have problems with this.  Monitor your bowel movements to look for signs of improvement or worsening. SEEK MEDICAL CARE IF:   You do not improve in the time expected.  Your condition worsens after initial improvement.  You develop any new symptoms. SEEK IMMEDIATE MEDICAL CARE IF:   You develop severe or prolonged rectal bleeding.  You vomit blood.  You feel weak or faint.  You have a fever. MAKE SURE YOU:  Understand these instructions.  Will watch your condition.  Will get help right away if you are not doing well or get worse. Document Released: 02/27/2002 Document Revised: 06/01/2011 Document Reviewed: 07/25/2010 [facility] Patient Information 2015 [place], [place]. This information is not intended to replace advice given to you by your health care provider. Make  sure you discuss any questions you have with your health care provider.

## 2014-07-25 NOTE — ED Notes (Signed)
Pt reports history if IBS. Pt reports emesis and near syncope last night. Pt reports this am "bright red bleeding in toilet and clots". Pt ambulates with steady gait and denies any dizziness at this time.

## 2014-07-25 NOTE — Telephone Encounter (Signed)
Noted and agree. 

## 2014-07-26 ENCOUNTER — Encounter: Payer: Self-pay | Admitting: Gastroenterology

## 2014-07-26 ENCOUNTER — Ambulatory Visit (INDEPENDENT_AMBULATORY_CARE_PROVIDER_SITE_OTHER): Payer: 59 | Admitting: Gastroenterology

## 2014-07-26 ENCOUNTER — Other Ambulatory Visit: Payer: Self-pay

## 2014-07-26 VITALS — BP 126/73 | HR 88 | Temp 98.4°F | Ht 63.0 in | Wt 139.2 lb

## 2014-07-26 DIAGNOSIS — K625 Hemorrhage of anus and rectum: Secondary | ICD-10-CM

## 2014-07-26 DIAGNOSIS — K6289 Other specified diseases of anus and rectum: Secondary | ICD-10-CM

## 2014-07-26 MED ORDER — MESALAMINE 1000 MG RE SUPP: 1000.0000 mg | Freq: Every day | RECTAL | Status: DC

## 2014-07-26 MED ORDER — MESALAMINE 1000 MG RE SUPP: 1000.0000 mg | Freq: Two times a day (BID) | RECTAL | Status: DC

## 2014-07-26 NOTE — Patient Instructions (Addendum)
Complete the course of antibiotics. I have sent a prescription for Canasa suppositories to take once each evening.   We have set you up for a flexible sigmoidoscopy with Dr. Gala Romney in the near future.  Please call with any recurrent issues!  Attached is a low residue diet to follow for the next few days.    Low-Fiber Diet Fiber is found in fruits, vegetables, and whole grains. A low-fiber diet restricts fibrous foods that are not digested in the small intestine. A diet containing about 10-15 grams of fiber per day is considered low fiber. Low-fiber diets may be used to:  Promote healing and rest the bowel during intestinal flare-ups.  Prevent blockage of a partially obstructed or narrowed gastrointestinal tract.  Reduce fecal weight and volume.  Slow the movement of feces. You may be on a low-fiber diet as a transitional diet following surgery, after an injury (trauma), or because of a short (acute) or lifelong (chronic) illness. Your health care provider will determine the length of time you need to stay on this diet.  WHAT DO I NEED TO KNOW ABOUT A LOW-FIBER DIET? Always check the fiber content on the packaging's Nutrition Facts label, especially on foods from the grains list. Ask your dietitian if you have questions about specific foods that are related to your condition, especially if the food is not listed below. In general, a low-fiber food will have less than 2 g of fiber. WHAT FOODS CAN I EAT? Grains All breads and crackers made with white flour. Sweet rolls, doughnuts, waffles, pancakes, [place] toast, bagels. Pretzels, Melba toast, zwieback. Well-cooked cereals, such as cornmeal, farina, or cream cereals. Dry cereals that do not contain whole grains, fruit, or nuts, such as refined corn, wheat, rice, and oat cereals. Potatoes prepared any way without skins, plain pastas and noodles, refined white rice. Use white flour for baking and making sauces. Use allowed list of grains for  casseroles, dumplings, and puddings.  Vegetables Strained tomato and vegetable juices. Fresh lettuce, cucumber, spinach. Well-cooked (no skin or pulp) or canned vegetables, such as asparagus, bean sprouts, beets, carrots, green beans, mushrooms, potatoes, pumpkin, spinach, yellow squash, tomato sauce/puree, turnips, yams, and zucchini. Keep servings limited to  cup.  Fruits All fruit juices except prune juice. Cooked or canned fruits without skin and seeds, such as applesauce, apricots, cherries, fruit cocktail, grapefruit, grapes, mandarin oranges, melons, peaches, pears, pineapple, and plums. Fresh fruits without skin, such as apricots, avocados, bananas, melons, pineapple, nectarines, and peaches. Keep servings limited to  cup or 1 piece.  Meat and Other Protein Sources Ground or well-cooked tender beef, ham, veal, lamb, pork, or poultry. Eggs, plain cheese. Fish, oysters, shrimp, lobster, and other seafood. Liver, organ meats. Smooth nut butters. Dairy All milk products and alternative dairy substitutes, such as soy, rice, almond, and coconut, not containing added whole nuts, seeds, or added fruit. Beverages Decaf coffee, fruit, and vegetable juices or smoothies (small amounts, with no pulp or skins, and with fruits from allowed list), sports drinks, herbal tea. Condiments Ketchup, mustard, vinegar, cream sauce, cheese sauce, cocoa powder. Spices in moderation, such as allspice, basil, bay leaves, celery powder or leaves, cinnamon, cumin powder, curry powder, ginger, mace, marjoram, onion or garlic powder, oregano, paprika, parsley flakes, ground pepper, rosemary, sage, savory, tarragon, thyme, and turmeric. Sweets and Desserts Plain cakes and cookies, pie made with allowed fruit, pudding, custard, cream pie. Gelatin, fruit, ice, sherbet, frozen ice pops. Ice cream, ice milk without nuts. Plain hard candy,  honey, jelly, molasses, syrup, sugar, chocolate syrup, gumdrops, marshmallows. Limit  overall sugar intake.  Fats and Oil Margarine, butter, cream, mayonnaise, salad oils, plain salad dressings made from allowed foods. Choose healthy fats such as olive oil, canola oil, and omega-3 fatty acids (such as found in salmon or tuna) when possible.  Other Bouillon, broth, or cream soups made from allowed foods. Any strained soup. Casseroles or mixed dishes made with allowed foods. The items listed above may not be a complete list of recommended foods or beverages. Contact your dietitian for more options.  WHAT FOODS ARE NOT RECOMMENDED? Grains All whole wheat and whole grain breads and crackers. Multigrains, rye, bran seeds, nuts, or coconut. Cereals containing whole grains, multigrains, bran, coconut, nuts, raisins. Cooked or dry oatmeal, steel-cut oats. Coarse wheat cereals, granola. Cereals advertised as high fiber. Potato skins. Whole grain pasta, wild or brown rice. Popcorn. Coconut flour. Bran, buckwheat, corn bread, multigrains, rye, wheat germ.  Vegetables Fresh, cooked or canned vegetables, such as artichokes, asparagus, beet greens, broccoli, Brussels sprouts, cabbage, celery, cauliflower, corn, eggplant, kale, legumes or beans, okra, peas, and tomatoes. Avoid large servings of any vegetables, especially raw vegetables.  Fruits Fresh fruits, such as apples with or without skin, berries, cherries, figs, grapes, grapefruit, guavas, kiwis, mangoes, oranges, papayas, pears, persimmons, pineapple, and pomegranate. Prune juice and juices with pulp, stewed or dried prunes. Dried fruits, dates, raisins. Fruit seeds or skins. Avoid large servings of all fresh fruits. Meats and Other Protein Sources Tough, fibrous meats with gristle. Chunky nut butter. Cheese made with seeds, nuts, or other foods not recommended. Nuts, seeds, legumes (beans, including baked beans), dried peas, beans, lentils.  Dairy Yogurt or cheese that contains nuts, seeds, or added fruit.  Beverages Fruit juices with  high pulp, prune juice. Caffeinated coffee and teas.  Condiments Coconut, maple syrup, pickles, olives. Sweets and Desserts Desserts, cookies, or candies that contain nuts or coconut, chunky peanut butter, dried fruits. Jams, preserves with seeds, marmalade. Large amounts of sugar and sweets. Any other dessert made with fruits from the not recommended list.  Other Soups made from vegetables that are not recommended or that contain other foods not recommended.  The items listed above may not be a complete list of foods and beverages to avoid. Contact your dietitian for more information. Document Released: 08/29/2001 Document Revised: 03/14/2013 Document Reviewed: 01/30/2013 [facility] Patient Information 2015 [place], [place]. This information is not intended to replace advice given to you by your health care provider. Make sure you discuss any questions you have with your health care provider.

## 2014-07-26 NOTE — Progress Notes (Signed)
Referring Provider: Manon Hilding, MD Primary Care Physician:  Manon Hilding, MD  Primary GI: Dr. Gala Romney   Chief Complaint  Patient presents with  . Rectal Bleeding    HPI:   Michele Villa is a 69 y.o. female presenting today with a history of chronic diarrhea and GERD. TCS/EGD on file from 2014. Colonoscopy with shallow left-sided colonic diverticulosis with benign segmental biopsies. Stool studies negative Nov 2015. Acute onset of rectal bleeding yesterday, 5.4, presenting to the ED. CT 07/25/14 with sigmoid diverticulosis without diverticulitis and mild thickening of rectum which may reflect proctitis. Hgb normal 14.2. Prescribed short course of antibiotics.   Yesterday almost like a really bad "period". States she had associated abdominal cramping. Night before got up around 3am, threw up, abdominal discomfort lower abdomen, wringing wet with sweat. Felt weak like she was going to pass out. had several bowel movements, got up and had to go to the bathroom and noted bleeding. No diarrhea currently.  Prior to this, would take Imodium every day, which helps but then leads to constipation. Now instead of taking several Imodium per day, just taking one each day.    Past Medical History  Diagnosis Date  . IBS (irritable bowel syndrome)   . Hyperlipidemia   . GERD (gastroesophageal reflux disease)   . H. pylori infection 2012    treated by Dr Quintin Alto  . Arthritis     neck, shoulder and knees  . Hypercholesterolemia     Past Surgical History  Procedure Laterality Date  . Colonoscopy  11/15/2002    RMR:Two anal papilla; otherwise, normal rectum, normal colon normal terminal ileum  . Tubal ligation    . Colonoscopy N/A 05/25/2012    Dr. Rourk:Shallow left sided colonic diverticulosis. Minimally hemorrhoids. Status post segmental biopsy, benign  . Esophagogastroduodenoscopy (egd) with esophageal dilation N/A 05/25/2012    Dr. Raliegh Scarlet reflux esophagitis/Hiatal hernia, soft distal  peptic stricture status post dilation. Small bowel biopsy benign.  . Esophageal biopsy N/A 05/25/2012    Procedure: BIOPSY;  Surgeon: Daneil Dolin, MD;  Location: [facility];  Service: Endoscopy;  Laterality: N/A;      Current Outpatient Prescriptions  Medication Sig Dispense Refill  . ciprofloxacin (CIPRO) 500 MG tablet Take 1 tablet (500 mg total) by mouth 2 (two) times daily. 20 tablet 0  . ezetimibe (ZETIA) 10 MG tablet Take 10 mg by mouth daily.    . Garlic 7616 MG CAPS Take by mouth.    . metroNIDAZOLE (FLAGYL) 500 MG tablet Take 1 tablet (500 mg total) by mouth 2 (two) times daily. 20 tablet 0  . Multiple Vitamins-Minerals (CENTRUM SILVER ULTRA WOMENS PO) Take by mouth.    . Omega-3 Fatty Acids (FISH OIL) 1200 MG CAPS Take by mouth.    . pantoprazole (PROTONIX) 40 MG tablet Take 1 tablet (40 mg total) by mouth 2 (two) times daily before a meal. 60 tablet 5  . DEXILANT 60 MG capsule TAKE ONE CAPSULE EVERY DAY (Patient not taking: Reported on 07/26/2014) 30 capsule 11  . mesalamine (CANASA) 1000 MG suppository Place 1 suppository (1,000 mg total) rectally at bedtime. 30 suppository 1   No current facility-administered medications for this visit.    Allergies as of 07/26/2014 - Review Complete 07/25/2014  Allergen Reaction Noted  . Statins Hives and Itching 05/12/2012    Family History  Problem Relation Age of Onset  . GER disease Mother   . Diabetes Brother   . CAD    .  Lung cancer Father   . GER disease Father   . Colon cancer Neg Hx     History   Social History  . Marital Status: Married    Spouse Name: N/A  . Number of Children: 1  . Years of Education: N/A   Occupational History  . retired, [employer]/gallery conservator    Social History Main Topics  . Smoking status: Former Smoker -- 15.00 packs/day    Types: Cigarettes    Quit date: 03/23/1980  . Smokeless tobacco: Not on file  . Alcohol Use: No     Comment: glass of wine with dinner twice a month  . Drug  Use: No  . Sexual Activity: Yes   Other Topics Concern  . None   Social History Narrative   Lives w/ husband    Review of Systems: As mentioned in HPI  Physical Exam: BP 126/73 mmHg  Pulse 88  Temp(Src) 98.4 F (36.9 C) (Oral)  Ht 5\' 3"  (1.6 m)  Wt 139 lb 3.2 oz (63.141 kg)  BMI 24.66 kg/m2 General:   Alert and oriented. No distress noted. Pleasant and cooperative.  Head:  Normocephalic and atraumatic. Eyes:  Conjuctiva clear without scleral icterus. Mouth:  Oral mucosa pink and moist. Good dentition. No lesions. Heart:  S1, S2 present without murmurs, rubs, or gallops. Regular rate and rhythm. Abdomen:  +BS, soft, non-tender and non-distended. No rebound or guarding. No HSM or masses noted. Msk:  Symmetrical without gross deformities. Normal posture. Extremities:  Without edema. Neurologic:  Alert and  oriented x4;  grossly normal neurologically. Skin:  Intact without significant lesions or rashes. Psych:  Alert and cooperative. Normal mood and affect.  Lab Results  Component Value Date   WBC 9.1 07/25/2014   HGB 14.2 07/25/2014   HCT 41.5 07/25/2014   MCV 82.3 07/25/2014   PLT 212 07/25/2014   Lab Results  Component Value Date   ALT 25 07/25/2014   AST 27 07/25/2014   ALKPHOS 93 07/25/2014   BILITOT 0.5 07/25/2014   Lab Results  Component Value Date   CREATININE 0.79 07/25/2014   BUN 13 07/25/2014   NA 136 07/25/2014   K 4.1 07/25/2014   CL 100* 07/25/2014   CO2 28 07/25/2014   CT 07/25/14:  IMPRESSION: Sigmoid diverticulosis without diverticulitis. Mild thickening of the rectum which may reflect proctitis. Negative for bowel obstruction  Normal appendix

## 2014-08-05 DIAGNOSIS — K625 Hemorrhage of anus and rectum: Secondary | ICD-10-CM | POA: Insufficient documentation

## 2014-08-05 NOTE — Assessment & Plan Note (Signed)
69 year old female with acute onset of rectal bleeding that appears to be hemodynamically insignificant, with CT noting mild thickening of rectum which could reflect proctitis. Last full colonoscopy in 2014 as noted above. Chronic diarrhea is at baseline and responds well to Imodium once daily. With evidence of possible proctitis on CT, discussed flex sig for direct visualization. May be benign in nature; will provide Canasa suppositories BID in interim.  Proceed with flex sig with Dr. Gala Romney in near future: the risks, benefits, and alternatives have been discussed with the patient in detail. The patient states understanding and desires to proceed. Low residue diet for now Canasa suppositories for supportive measures

## 2014-08-09 NOTE — Progress Notes (Signed)
CC'ED TO PCP 

## 2014-08-15 ENCOUNTER — Encounter (HOSPITAL_COMMUNITY): Payer: Self-pay

## 2014-08-15 ENCOUNTER — Ambulatory Visit (HOSPITAL_COMMUNITY): Admit: 2014-08-15 | Payer: 59 | Admitting: Internal Medicine

## 2014-08-15 SURGERY — SIGMOIDOSCOPY, FLEXIBLE
Anesthesia: Moderate Sedation

## 2014-09-17 ENCOUNTER — Other Ambulatory Visit: Payer: Self-pay

## 2015-01-09 ENCOUNTER — Ambulatory Visit (HOSPITAL_COMMUNITY): Payer: 59 | Attending: Family Medicine | Admitting: Specialist

## 2015-01-09 DIAGNOSIS — M7551 Bursitis of right shoulder: Secondary | ICD-10-CM | POA: Insufficient documentation

## 2015-01-09 DIAGNOSIS — M25611 Stiffness of right shoulder, not elsewhere classified: Secondary | ICD-10-CM

## 2015-01-09 DIAGNOSIS — M25511 Pain in right shoulder: Secondary | ICD-10-CM | POA: Diagnosis present

## 2015-01-09 NOTE — Patient Instructions (Signed)
Upper, mid trapezius tightness, scapular tightness along all borders, supraspinatus and rhomboids tight - daughter could massage these areas   Flexibility: Corner Stretch   Standing in corner with hands just above shoulder level and feet ____ inches from corner, lean forward until a comfortable stretch is felt across chest. Hold ____ seconds. Repeat ____ times per set. Do ____ sets per session. Do ____ sessions per day.  http://orth.exer.us/342   Copyright  VHI. All rights reserved.   Scapular Retraction (Standing)   With arms at sides, pinch shoulder blades together. Repeat ____ times per set. Do ____ sets per session. Do ____ sessions per day.  http://orth.exer.us/944   Copyright  VHI. All rights reserved.    Copyright  VHI. All rights reserved.   Posterior Capsule Stretch   Stand or sit, one arm across body so hand rests over opposite shoulder. Gently push on crossed elbow with other hand until stretch is felt in shoulder of crossed arm. Hold ___ seconds.  Repeat ___ times per session. Do ___ sessions per day.  Copyright  VHI. All rights reserved.   Flexors Stretch, Standing   Stand near wall and slide arm up, with palm facing away from wall, by leaning toward wall. Hold ___ seconds.  Repeat ___ times per session. Do ___ sessions per day.  Copyright  VHI. All rights reserved.  Flexibility: Neck Stretch   Grasp left arm above wrist and pull down across body while gently tilting head same direction. Hold for 3 seconds. Repeat 10 times.   Levator Scapula Stretch   Place left hand on same side shoulder blade. With other hand, gently stretch head down and away. Hold 3 seconds. Repeat 10 times.   Flexibility: Neck Stretch   Grasp left arm above wrist and pull down across body while gently tilting head same direction. Hold 3 seconds. Repeat 10 times.  Flexibility: Neck Retraction   Pull head straight back, keeping eyes and jaw level. *Give yourself a  double chin.* Hold 3 seconds. Repeat 10 times.   Lower Cervical / Upper Thoracic Stretch   Clasp hands together in front with arms extended. Gently pull shoulder blades apart and bend head forward. Hold 3 seconds. Repeat 10 times.

## 2015-01-09 NOTE — Therapy (Addendum)
[place], [place], [address] Phone: [phone]   Fax:  [phone]  Occupational Therapy Evaluation  Patient Details  Name: Michele Villa MRN: [id number] Date of Birth: 1945-05-03 Referring Provider: Dr. Consuello Masse  Encounter Date: 01/09/2015      OT End of Session - 01/09/15 1425    Visit Number 1   Number of Visits 8   Date for OT Re-Evaluation 03/10/15  mini reassess on 02/07/15   Authorization Type UHC and Medicare part A   Authorization Time Period 29 visits per year authorized   Authorization - Visit Number 1   Authorization - Number of Visits 96   OT Start Time 3419   OT Stop Time 1015   OT Time Calculation (min) 40 min   Activity Tolerance Patient tolerated treatment well   Behavior During Therapy [facility] for tasks assessed/performed      Past Medical History  Diagnosis Date  . IBS (irritable bowel syndrome)   . Hyperlipidemia   . GERD (gastroesophageal reflux disease)   . H. pylori infection 2012    treated by Dr Quintin Alto  . Arthritis     neck, shoulder and knees  . Hypercholesterolemia     Past Surgical History  Procedure Laterality Date  . Colonoscopy  11/15/2002    RMR:Two anal papilla; otherwise, normal rectum, normal colon normal terminal ileum  . Tubal ligation    . Colonoscopy N/A 05/25/2012    Dr. Rourk:Shallow left sided colonic diverticulosis. Minimally hemorrhoids. Status post segmental biopsy, benign  . Esophagogastroduodenoscopy (egd) with esophageal dilation N/A 05/25/2012    Dr. Raliegh Scarlet reflux esophagitis/Hiatal hernia, soft distal peptic stricture status post dilation. Small bowel biopsy benign.  . Esophageal biopsy N/A 05/25/2012    Procedure: BIOPSY;  Surgeon: Daneil Dolin, MD;  Location: [facility];  Service: Endoscopy;  Laterality: N/A;      There were no vitals filed for this visit.  Visit Diagnosis:  Pain in joint of right shoulder - Plan: Ot plan of care  cert/re-cert  Stiffness of right shoulder joint - Plan: Ot plan of care cert/re-cert  Shoulder bursitis, right - Plan: Ot plan of care cert/re-cert      Subjective Assessment - 01/09/15 1406    Subjective  S:  I washed my daughters windows a few months ago and my shoulder has hurt ever since.   Pertinent History Michele Villa states that a few months ago she was washing windows (several in one day) and began experiencing increased pain with certain functional movements.  She consulted with Dr. Quintin Alto and was diagnosed with bursitis of her right shoulder.  She declilned a cortisone injection offered by Dr. Quintin Alto and opted for occupatonal therapy intervention instead.  Patient has been referred to occupational therapy for evaluation and treatment.     Patient Stated Goals I want my shoulder to stop hurting.     Currently in Pain? Yes   Pain Score 7    Pain Location Shoulder   Pain Orientation Right   Pain Descriptors / Indicators Aching   Pain Type Acute pain   Pain Onset More than a month ago   Pain Frequency Intermittent   Aggravating Factors  drying hair and reaching across her body   Pain Relieving Factors rest   Effect of Pain on Daily Activities difficult to dry her hair and reach across her body           [facility] OT Assessment -  01/09/15 0001    Assessment   Diagnosis Right Shoulder Bursitis   Referring Provider Dr. Consuello Masse   Onset Date 07/22/14   Prior Therapy none   Precautions   Precautions None   Restrictions   Weight Bearing Restrictions No   Balance Screen   Has the patient fallen in the past 6 months No   Has the patient had a decrease in activity level because of a fear of falling?  No   Is the patient reluctant to leave their home because of a fear of falling?  No   Home  Environment   Family/patient expects to be discharged to: Private residence   Lives With Spouse   Prior Function   Level of [place] Retired   Leisure  enjoys traveling, exercising, and walking   ADL   ADL comments Patient has increased pain when reaching across her body and when completing washing/drying of her hair   Written Expression   Dominant Hand Right   Vision - History   Baseline Vision Wears glasses all the time   Cognition   Overall Cognitive Status Within Functional Limits for tasks assessed   Observation/Other Assessments   Focus on Therapeutic Outcomes (FOTO)  44/100   Sensation   Light Touch Appears Intact   Coordination   Gross Motor Movements are Fluid and Coordinated Yes   Fine Motor Movements are Fluid and Coordinated Yes   ROM / Strength   AROM / PROM / Strength AROM;Strength   Palpation   Palpation comment moderate fascial restrictions in her right upper and mid trapezius, supraspinatus, and upper arm region, rhomboids are also tight   AROM   Overall AROM Comments assessed in seated, ER/IR with shoulder abducted to 90   Right Shoulder Flexion 130 Degrees   Right Shoulder ABduction 170 Degrees   Right Shoulder Internal Rotation 90 Degrees   Right Shoulder External Rotation 80 Degrees   PROM   Overall PROM Comments PROM is [facility] for all shoulder ranges assessed this date.   Strength   Overall Strength Comments assessed upper, mid, and lower trapezius strength - WNL except for mid trap is 4/5.  Subscapularis strength is 5/5   Right/Left Shoulder Right   Right Shoulder Flexion 5/5   Right Shoulder Extension 5/5   Right Shoulder ABduction 5/5   Right Shoulder Internal Rotation 5/5   Right Shoulder External Rotation 5/5                  OT Treatments/Exercises (OP) - 01/09/15 0001    Manual Therapy   Manual Therapy Myofascial release   Manual therapy comments Myofascial release completed to upper arm, scapular, trapezius regions and associated areas to decrease pain and restrictions and allow for pain free movement in her RUE.                 OT Education - 01/09/15 1423    Education  provided Yes   Education Details Educated patient to complete self massage with tennis ball either lying on ground or standing against the wall, heat shoulder and scapular region, educated on shoulder and cervical stretches.    Person(s) Educated Patient   Methods Explanation;Demonstration;Handout   Comprehension Verbalized understanding;Returned demonstration          OT Short Term Goals - 01/09/15 1440    OT SHORT TERM GOAL #1   Title Patient will be educated on a HEP to decrease pain and improve mobility and strength in  her right shoulder.   Time 2   Period Weeks   Status New   OT SHORT TERM GOAL #2   Title Patient will improve right shoulder flexion to 150 or better for increased ability to reach into overhead cabinet.   Time 2   Period Weeks   Status New   OT SHORT TERM GOAL #3   Title Patient will improve mid trapezius strength to 4/5 for increased ability to maintain retracted scapular position at rest.   Time 2   Period Weeks   Status New   OT SHORT TERM GOAL #4   Title Patient will have 4/10 pain or better when drying her hair.   Time 2   Period Weeks   Status New   OT SHORT TERM GOAL #5   Title Patient will have min fascial restrictions in her scapular and trapezius region for improved ability to use right arm functionally.   Time 2   Period Weeks   Status New           OT Long Term Goals - 01/09/15 1443    OT LONG TERM GOAL #1   Title Patient will return to prior level of independence with all B/IADLs and leisure activities, using right arm as dominant.   Time 4   Period Weeks   Status New   OT LONG TERM GOAL #2   Title Patient will have normal range in her right shoulder for increased ability to reach into overhead cabinets.   Time 4   Period Weeks   Status New   OT LONG TERM GOAL #3   Title Patient will have 5/5 strength in mid trapezius and improved scapular/postural stability while completing functional activities.    Time 4   Period Weeks    Status New   OT LONG TERM GOAL #4   Title Patient will have 2/10 pain or less in her right shoulder when completing functional activities.    Time 4   Period Weeks   Status New   OT LONG TERM GOAL #5   Title Patient will have trace fascial restrictions in her right shoulder region for greater mobility in right shoulder during functional activities.    Time 4   Period Weeks   Status New               Plan - 01/09/15 1426    Clinical Impression Statement A: Patient is a 69 year old female with past medical history significant for IBS and acid reflux.  Patient is retired and very active.  After washing several windows in a short period of time, patient began to notice inceased pain and decreased mobility in her right shoulder.  She is limited in A/ROM of flexion and strength in her mid trapezius.  Patient reports pain when she is reaching across her body duing functional activities and when she dries or styles her hair. Patient will benefit from skilled occupational therapy intervention to decrease pain and restrictions and improve pain free mobilty and strength needed for daily activities.    Pt will benefit from skilled therapeutic intervention in order to improve on the following deficits (Retired) Decreased range of motion;Increased fascial restricitons;Increased muscle spasms;Pain   Rehab Potential Excellent   OT Frequency 2x / week   OT Duration 4 weeks   OT Treatment/Interventions Self-care/ADL training;Electrical Stimulation;Moist Heat;Therapeutic exercise;Neuromuscular education;Manual Therapy;Therapeutic activities;Therapeutic exercises;Passive range of motion;Patient/family education   Plan P: Skilled OT intervention to decrease pain and restrictions and improve pain free  mobility and strength needed to complete all desired daily activities without pain.  Next treatment session:  AROM, scapular/postural exercises to improve alignment of shoulder region, strengthening for mid  trapezius, and range of motion exercises for end range flexion needed for functinal activities at home.     Consulted and Agree with Plan of Care Patient      G codes:  Carrying and Moving Items Initial CK, Goal CJ  Problem List Patient Active Problem List   Diagnosis Date Noted  . Rectal bleeding 08/05/2014  . H. pylori infection 02/08/2014  . IBS (irritable bowel syndrome) 06/20/2012  . Diarrhea 05/12/2012  . Dysphagia, pharyngoesophageal phase 05/12/2012  . GERD (gastroesophageal reflux disease) 05/12/2012  . Incontinent of feces 05/12/2012    Vangie Bicker, OTR/L [phone]  01/09/2015, 2:55 PM  [facility] [address] [place], [place], [address] Phone: [phone]   Fax:  [phone]  Name: Michele Villa MRN: [id number] Date of Birth: 08-Oct-1945

## 2015-01-16 ENCOUNTER — Ambulatory Visit (HOSPITAL_COMMUNITY): Payer: 59

## 2015-01-16 ENCOUNTER — Encounter (HOSPITAL_COMMUNITY): Payer: Self-pay

## 2015-01-16 DIAGNOSIS — M25611 Stiffness of right shoulder, not elsewhere classified: Secondary | ICD-10-CM

## 2015-01-16 DIAGNOSIS — M25511 Pain in right shoulder: Secondary | ICD-10-CM | POA: Diagnosis not present

## 2015-01-16 NOTE — Therapy (Signed)
[place], [place], [address] Phone: [phone]   Fax:  [phone]  Occupational Therapy Treatment  Patient Details  Name: Michele Villa MRN: [id number] Date of Birth: 1946-03-11 Referring Provider: Dr. Consuello Masse  Encounter Date: 01/16/2015      OT End of Session - 01/16/15 0929    Visit Number 2   Number of Visits 8   Date for OT Re-Evaluation 03/10/15  mini reassess on 02/07/15   Authorization Type UHC and Medicare part A   Authorization Time Period 14 visits per year authorized   Authorization - Visit Number 2   Authorization - Number of Visits 109   OT Start Time [phone]   OT Stop Time 0930   OT Time Calculation (min) 40 min   Activity Tolerance Patient tolerated treatment well   Behavior During Therapy [facility] for tasks assessed/performed      Past Medical History  Diagnosis Date  . IBS (irritable bowel syndrome)   . Hyperlipidemia   . GERD (gastroesophageal reflux disease)   . H. pylori infection 2012    treated by Dr Quintin Alto  . Arthritis     neck, shoulder and knees  . Hypercholesterolemia     Past Surgical History  Procedure Laterality Date  . Colonoscopy  11/15/2002    RMR:Two anal papilla; otherwise, normal rectum, normal colon normal terminal ileum  . Tubal ligation    . Colonoscopy N/A 05/25/2012    Dr. Rourk:Shallow left sided colonic diverticulosis. Minimally hemorrhoids. Status post segmental biopsy, benign  . Esophagogastroduodenoscopy (egd) with esophageal dilation N/A 05/25/2012    Dr. Raliegh Scarlet reflux esophagitis/Hiatal hernia, soft distal peptic stricture status post dilation. Small bowel biopsy benign.  . Esophageal biopsy N/A 05/25/2012    Procedure: BIOPSY;  Surgeon: Daneil Dolin, MD;  [facility];  Service: Endoscopy;  Laterality: N/A;      There were no vitals filed for this visit.  Visit Diagnosis:  Pain in joint of right shoulder  Stiffness of right  shoulder joint      Subjective Assessment - 01/16/15 0912    Subjective  S: I haven't really done any of the exercises that I was given.   Currently in Pain? No/denies            [facility] OT Assessment - 01/16/15 0914    Assessment   Diagnosis Right Shoulder Bursitis   Precautions   Precautions None                  OT Treatments/Exercises (OP) - 01/16/15 0914    Exercises   Exercises Shoulder   Shoulder Exercises: Supine   Protraction PROM;5 reps;Strengthening;12 reps   Protraction Weight (lbs) 1   Horizontal ABduction PROM;5 reps;Strengthening;12 reps   Horizontal ABduction Weight (lbs) 1   External Rotation PROM;5 reps;Strengthening;12 reps   External Rotation Weight (lbs) 1   Internal Rotation PROM;5 reps;Strengthening;12 reps   Internal Rotation Weight (lbs) 1   Flexion PROM;5 reps;Strengthening;12 reps   Shoulder Flexion Weight (lbs) 1   ABduction PROM;5 reps;Strengthening;12 reps   Shoulder ABduction Weight (lbs) 1   Shoulder Exercises: Standing   Protraction AROM;12 reps   Horizontal ABduction AROM;12 reps   External Rotation AROM;12 reps   Internal Rotation AROM;12 reps   Flexion AROM;12 reps   ABduction AROM;12 reps   Shoulder Exercises: ROM/Strengthening   UBE (Upper Arm Bike) Level 1 2' reverse  Focusing on scapular retraction and speed at 2.5-3.0  X to V Arms 12X   Pendulum 12X no rest breaks   Proximal Shoulder Strengthening, Supine 12X 1# no rest breaks                  OT Short Term Goals - 01/16/15 0912    OT SHORT TERM GOAL #1   Title Patient will be educated on a HEP to decrease pain and improve mobility and strength in her right shoulder.   Status On-going   OT SHORT TERM GOAL #2   Title Patient will improve right shoulder flexion to 150 or better for increased ability to reach into overhead cabinet.   Status On-going   OT SHORT TERM GOAL #3   Title Patient will improve mid trapezius strength to 4/5 for increased  ability to maintain retracted scapular position at rest.   Status On-going   OT SHORT TERM GOAL #4   Title Patient will have 4/10 pain or better when drying her hair.   Status On-going   OT SHORT TERM GOAL #5   Title Patient will have min fascial restrictions in her scapular and trapezius region for improved ability to use right arm functionally.   Status On-going           OT Long Term Goals - 01/16/15 0913    OT LONG TERM GOAL #1   Title Patient will return to prior level of independence with all B/IADLs and leisure activities, using right arm as dominant.   Status On-going   OT LONG TERM GOAL #2   Title Patient will have normal range in her right shoulder for increased ability to reach into overhead cabinets.   Status On-going   OT LONG TERM GOAL #3   Title Patient will have 5/5 strength in mid trapezius and improved scapular/postural stability while completing functional activities.    Status On-going   OT LONG TERM GOAL #4   Title Patient will have 2/10 pain or less in her right shoulder when completing functional activities.    Status On-going   OT LONG TERM GOAL #5   Title Patient will have trace fascial restrictions in her right shoulder region for greater mobility in right shoulder during functional activities.    Status On-going               Plan - 01/16/15 1037    Clinical Impression Statement A: Initiated myofascial release, manual stretching, and RUE strengthening. Pt neede min VC for technique and form. Reports of pain with supine horzontal abduction and abduction.   Plan P: Give print out of OT evaluation and review goals. Add prone scapular raises and hughston exercises.         Problem List Patient Active Problem List   Diagnosis Date Noted  . Rectal bleeding 08/05/2014  . H. pylori infection 02/08/2014  . IBS (irritable bowel syndrome) 06/20/2012  . Diarrhea 05/12/2012  . Dysphagia, pharyngoesophageal phase 05/12/2012  . GERD  (gastroesophageal reflux disease) 05/12/2012  . Incontinent of feces 05/12/2012    Ailene Ravel, OTR/L,CBIS  [phone]  01/16/2015, 10:40 AM  [place], [place], [address] Phone: [phone]   Fax:  [phone]  Name: Michele Villa MRN: [id number] Date of Birth: 1945/07/30

## 2015-01-18 ENCOUNTER — Ambulatory Visit (HOSPITAL_COMMUNITY): Payer: 59 | Admitting: Occupational Therapy

## 2015-01-18 ENCOUNTER — Encounter (HOSPITAL_COMMUNITY): Payer: Self-pay | Admitting: Occupational Therapy

## 2015-01-18 DIAGNOSIS — M25511 Pain in right shoulder: Secondary | ICD-10-CM

## 2015-01-18 DIAGNOSIS — M25611 Stiffness of right shoulder, not elsewhere classified: Secondary | ICD-10-CM

## 2015-01-18 NOTE — Patient Instructions (Signed)
1) Shoulder Protraction    Begin with elbows by your side, slowly "punch" straight out in front of you keeping arms/elbows straight. Repeat _10-15__times, _2___set/day.     2) Shoulder Flexion  Supine:     Standing:         Begin with arms at your side with thumbs pointed up, slowly raise both arms up and forward towards overhead. Repeat_10-15__times, _2__set/day.               3) Horizontal abduction/adduction  Supine:   Standing:           Begin with arms straight out in front of you, bring out to the side in at "T" shape. Keep arms straight entire time. Repeat _10-15___times, __2__sets/day.                 4) Internal & External Rotation    *No band* -Stand with elbows at the side and elbows bent 90 degrees. Move your forearms away from your body, then bring back inward toward the body.  Repeat _10-15__times, _2__sets/day    5) Shoulder Abduction  Supine:     Standing:       Lying on your back begin with your arms flat on the table next to your side. Slowly move your arms out to the side so that they go overhead, in a jumping jack or snow angel movement. Repeat _10-15__times, _2__sets/day        (Home) Extension: Isometric / Bilateral Arm Retraction - Sitting   Facing anchor, hold hands and elbow at shoulder height, with elbow bent.  Pull arms back to squeeze shoulder blades together. Repeat 10-15 times.  Copyright  VHI. All rights reserved.   (Home) Retraction: Row - Bilateral (Anchor)   Facing anchor, arms reaching forward, pull hands toward stomach, keeping elbows bent and at your sides and pinching shoulder blades together. Repeat 10-15 times.  Copyright  VHI. All rights reserved.   (Clinic) Extension / Flexion (Assist)   Face anchor, pull arms back, keeping elbow straight, and squeze shoulder blades together. Repeat 10-15 times.   Copyright  VHI. All rights reserved.   

## 2015-01-18 NOTE — Therapy (Signed)
[place], [place], [address] Phone: [phone]   Fax:  [phone]  Occupational Therapy Treatment  Patient Details  Name: Michele Villa MRN: [id number] Date of Birth: Mar 13, 1946 Referring Provider: Dr. Consuello Masse  Encounter Date: 01/18/2015      OT End of Session - 01/18/15 1418    Visit Number 3   Number of Visits 8   Date for OT Re-Evaluation 03/10/15  mini reassess on 02/07/15   Authorization Type UHC and Medicare part A   Authorization Time Period 71 visits per year authorized   Authorization - Visit Number 3   Authorization - Number of Visits 64   OT Start Time 1302   OT Stop Time 1345   OT Time Calculation (min) 43 min   Activity Tolerance Patient tolerated treatment well   Behavior During Therapy [facility] for tasks assessed/performed      Past Medical History  Diagnosis Date  . IBS (irritable bowel syndrome)   . Hyperlipidemia   . GERD (gastroesophageal reflux disease)   . H. pylori infection 2012    treated by Dr Quintin Alto  . Arthritis     neck, shoulder and knees  . Hypercholesterolemia     Past Surgical History  Procedure Laterality Date  . Colonoscopy  11/15/2002    RMR:Two anal papilla; otherwise, normal rectum, normal colon normal terminal ileum  . Tubal ligation    . Colonoscopy N/A 05/25/2012    Dr. Rourk:Shallow left sided colonic diverticulosis. Minimally hemorrhoids. Status post segmental biopsy, benign  . Esophagogastroduodenoscopy (egd) with esophageal dilation N/A 05/25/2012    Dr. Raliegh Scarlet reflux esophagitis/Hiatal hernia, soft distal peptic stricture status post dilation. Small bowel biopsy benign.  . Esophageal biopsy N/A 05/25/2012    Procedure: BIOPSY;  Surgeon: Daneil Dolin, MD;  [facility];  Service: Endoscopy;  Laterality: N/A;      There were no vitals filed for this visit.  Visit Diagnosis:  Pain in joint of right shoulder  Stiffness of right  shoulder joint      Subjective Assessment - 01/18/15 1302    Subjective  S: It only bothers me if I move it.    Currently in Pain? No/denies                      OT Treatments/Exercises (OP) - 01/18/15 1303    Exercises   Exercises Shoulder   Shoulder Exercises: Supine   Protraction PROM;5 reps;Strengthening;12 reps   Protraction Weight (lbs) 1   Horizontal ABduction PROM;5 reps;Strengthening;12 reps   Horizontal ABduction Weight (lbs) 1   External Rotation PROM;5 reps;Strengthening;12 reps   External Rotation Weight (lbs) 1   Internal Rotation PROM;5 reps;Strengthening;12 reps   Internal Rotation Weight (lbs) 1   Flexion PROM;5 reps;Strengthening;12 reps   Shoulder Flexion Weight (lbs) 1   ABduction PROM;5 reps;Strengthening;12 reps   Shoulder ABduction Weight (lbs) 1   Shoulder Exercises: Prone   Other Prone Exercises Hughston exercises, 5 positions, 10X each   Other Prone Exercises scapular raises, 10X   Shoulder Exercises: Standing   Protraction AROM;12 reps   Horizontal ABduction AROM;12 reps   External Rotation AROM;12 reps   Internal Rotation AROM;12 reps   Flexion AROM;12 reps   ABduction AROM;12 reps   Extension Theraband;10 reps   Theraband Level (Shoulder Extension) Level 2 (Red)   Row Theraband;10 reps   Theraband Level (Shoulder Row) Level 2 (Red)   Retraction Theraband;10 reps  Theraband Level (Shoulder Retraction) Level 2 (Red)   Shoulder Exercises: ROM/Strengthening   UBE (Upper Arm Bike) Level 1 2' forward 2' reverse   X to V Arms 12X   Proximal Shoulder Strengthening, Supine 12X 1# no rest breaks   Proximal Shoulder Strengthening, Seated 12X each 1# weight   Manual Therapy   Manual Therapy Myofascial release   Manual therapy comments Myofascial release completed to upper arm, scapular, trapezius regions and associated areas to decrease pain and restrictions and allow for pain free movement in her RUE.                  OT  Education - 01/18/15 1340    Education provided Yes   Education Details Strengthening exercises, scapular theraband exercises (red)   Person(s) Educated Patient   Methods Explanation;Demonstration;Handout   Comprehension Verbalized understanding;Returned demonstration          OT Short Term Goals - 01/16/15 0912    OT SHORT TERM GOAL #1   Title Patient will be educated on a HEP to decrease pain and improve mobility and strength in her right shoulder.   Status On-going   OT SHORT TERM GOAL #2   Title Patient will improve right shoulder flexion to 150 or better for increased ability to reach into overhead cabinet.   Status On-going   OT SHORT TERM GOAL #3   Title Patient will improve mid trapezius strength to 4/5 for increased ability to maintain retracted scapular position at rest.   Status On-going   OT SHORT TERM GOAL #4   Title Patient will have 4/10 pain or better when drying her hair.   Status On-going   OT SHORT TERM GOAL #5   Title Patient will have min fascial restrictions in her scapular and trapezius region for improved ability to use right arm functionally.   Status On-going           OT Long Term Goals - 01/16/15 0913    OT LONG TERM GOAL #1   Title Patient will return to prior level of independence with all B/IADLs and leisure activities, using right arm as dominant.   Status On-going   OT LONG TERM GOAL #2   Title Patient will have normal range in her right shoulder for increased ability to reach into overhead cabinets.   Status On-going   OT LONG TERM GOAL #3   Title Patient will have 5/5 strength in mid trapezius and improved scapular/postural stability while completing functional activities.    Status On-going   OT LONG TERM GOAL #4   Title Patient will have 2/10 pain or less in her right shoulder when completing functional activities.    Status On-going   OT LONG TERM GOAL #5   Title Patient will have trace fascial restrictions in her right shoulder  region for greater mobility in right shoulder during functional activities.    Status On-going               Plan - 01/18/15 1418    Clinical Impression Statement A: Added prone hughston exercises and prone scapular raises, red scapular theraband exercises. Continued with strengthening. Pt continues to report pain with horizontal abduction and adduction as well discomfort with abduction. Provided pt with strengthening HEP and scapular theraband HEP.    Plan P: Give OT evalutaion and review. Add ball on wall, add 1# weight to prone exercises.         Problem List Patient Active Problem List   Diagnosis  Date Noted  . Rectal bleeding 08/05/2014  . H. pylori infection 02/08/2014  . IBS (irritable bowel syndrome) 06/20/2012  . Diarrhea 05/12/2012  . Dysphagia, pharyngoesophageal phase 05/12/2012  . GERD (gastroesophageal reflux disease) 05/12/2012  . Incontinent of feces 05/12/2012    Guadelupe Sabin, OTR/L  [phone]  01/18/2015, 2:21 PM  [place] [address] [place], [place], [address] Phone: [phone]   Fax:  [phone]  Name: Michele Villa MRN: [id number] Date of Birth: 12-Oct-1945

## 2015-01-23 ENCOUNTER — Ambulatory Visit (HOSPITAL_COMMUNITY): Payer: 59 | Attending: Family Medicine

## 2015-01-23 ENCOUNTER — Encounter (HOSPITAL_COMMUNITY): Payer: Self-pay

## 2015-01-23 DIAGNOSIS — R29898 Other symptoms and signs involving the musculoskeletal system: Secondary | ICD-10-CM | POA: Insufficient documentation

## 2015-01-23 DIAGNOSIS — M25511 Pain in right shoulder: Secondary | ICD-10-CM | POA: Diagnosis present

## 2015-01-23 DIAGNOSIS — M25611 Stiffness of right shoulder, not elsewhere classified: Secondary | ICD-10-CM | POA: Diagnosis present

## 2015-01-23 NOTE — Patient Instructions (Signed)
Complete these stretch prior to exercising and also afterwards.   Arm Reach, Up and Back (Shoulder Mobility / Triceps Stretch)    Stretch arm up while breathing out. Reach back and walk fingers down spine, using other hand to push elbow down. Hold _10__ seconds, breathing slowly in and out through pursed lips. Repeat with other arm.   Pectoral Stretch With Shoulder Blade Squeeze    Stand, hands clasped behind back. Squeeze shoulder blades. Move only shoulder blades. Keep hands close to body. Do not shrug shoulders. Hold _10__ seconds. Repeat _2__ times per session.   Copyright  VHI. All rights reserved.   Shoulder Stretch    Stand and clasp hands in front of body. Extend arms out and push, then down and push. Feel shoulder blades stretch. Hold each position __10__ seconds. Repeat __2__ times.   http://[email or web address].exer.us/58   Copyright  VHI. All rights reserved.    Posterior Capsule Stretch (Passive)    Stand or sit holding under one elbow with opposite hand. Pull arm across chest. Hold _10__ seconds. Repeat _2__ times per session.   Copyright  VHI. All rights reserved.

## 2015-01-23 NOTE — Therapy (Signed)
[place], [place], [address] Phone: [phone]   Fax:  [phone]  Occupational Therapy Treatment  Patient Details  Name: Michele Villa MRN: [id number] Date of Birth: 08-22-1945 Referring Provider: Dr. Consuello Masse  Encounter Date: 01/23/2015      OT End of Session - 01/23/15 0939    Visit Number 4   Number of Visits 8   Date for OT Re-Evaluation 03/10/15  mini reassess on 02/07/15   Authorization Type UHC and Medicare part A   Authorization Time Period 18 visits per year authorized   Authorization - Visit Number 4   Authorization - Number of Visits 75   OT Start Time 0845   OT Stop Time 0930   OT Time Calculation (min) 45 min   Activity Tolerance Patient tolerated treatment well   Behavior During Therapy [facility] for tasks assessed/performed      Past Medical History  Diagnosis Date  . IBS (irritable bowel syndrome)   . Hyperlipidemia   . GERD (gastroesophageal reflux disease)   . H. pylori infection 2012    treated by Dr Quintin Alto  . Arthritis     neck, shoulder and knees  . Hypercholesterolemia     Past Surgical History  Procedure Laterality Date  . Colonoscopy  11/15/2002    RMR:Two anal papilla; otherwise, normal rectum, normal colon normal terminal ileum  . Tubal ligation    . Colonoscopy N/A 05/25/2012    Dr. Rourk:Shallow left sided colonic diverticulosis. Minimally hemorrhoids. Status post segmental biopsy, benign  . Esophagogastroduodenoscopy (egd) with esophageal dilation N/A 05/25/2012    Dr. Raliegh Scarlet reflux esophagitis/Hiatal hernia, soft distal peptic stricture status post dilation. Small bowel biopsy benign.  . Esophageal biopsy N/A 05/25/2012    Procedure: BIOPSY;  Surgeon: Daneil Dolin, MD;  [facility];  Service: Endoscopy;  Laterality: N/A;      There were no vitals filed for this visit.  Visit Diagnosis:  Shoulder weakness  Stiffness of right shoulder joint       Subjective Assessment - 01/23/15 0909    Subjective  S: Is ok for me to use weights at the gym?   Currently in Pain? No/denies            [facility] OT Assessment - 01/23/15 0854    Assessment   Diagnosis Right Shoulder Bursitis   Precautions   Precautions None                  OT Treatments/Exercises (OP) - 01/23/15 0854    Exercises   Exercises Shoulder   Shoulder Exercises: Supine   Protraction PROM;5 reps;Strengthening;12 reps   Protraction Weight (lbs) 2   Horizontal ABduction PROM;5 reps;Strengthening;12 reps   Horizontal ABduction Weight (lbs) 2   External Rotation PROM;5 reps;Strengthening;12 reps   External Rotation Weight (lbs) 2   Internal Rotation PROM;5 reps;Strengthening;12 reps   Internal Rotation Weight (lbs) 2   Flexion PROM;5 reps;Strengthening;12 reps   Shoulder Flexion Weight (lbs) 2   ABduction PROM;5 reps;Strengthening;12 reps   Shoulder ABduction Weight (lbs) 2   Shoulder Exercises: ROM/Strengthening   UBE (Upper Arm Bike) Level 1 2' reverse 2' forward   Cybex Press 2 plate;15 reps   Cybex Row 2 plate;15 reps   Shoulder Exercises: Stretch   Cross Chest Stretch 1 rep;10 seconds   Other Shoulder Stretches Posterior capsule stretch, pectorial stretch, triceps stretchl 10 second hold; 1 set   Manual Therapy   Manual  Therapy Myofascial release   Myofascial Release Myofascial release and manual stretching to right upper arm, trapezius, and scapularis region to decrease fascial restrictions and increase joint mobility in a pain free zone.                 OT Education - 01/23/15 [phone]    Education provided Yes   Education Details Shoulder stretches. reviewed plan of care and therapy goals. pt given print out of evaluation.   Person(s) Educated Patient   Methods Explanation;Demonstration;Handout   Comprehension Returned demonstration;Verbalized understanding          OT Short Term Goals - 01/16/15 0912    OT SHORT TERM GOAL #1    Title Patient will be educated on a HEP to decrease pain and improve mobility and strength in her right shoulder.   Status On-going   OT SHORT TERM GOAL #2   Title Patient will improve right shoulder flexion to 150 or better for increased ability to reach into overhead cabinet.   Status On-going   OT SHORT TERM GOAL #3   Title Patient will improve mid trapezius strength to 4/5 for increased ability to maintain retracted scapular position at rest.   Status On-going   OT SHORT TERM GOAL #4   Title Patient will have 4/10 pain or better when drying her hair.   Status On-going   OT SHORT TERM GOAL #5   Title Patient will have min fascial restrictions in her scapular and trapezius region for improved ability to use right arm functionally.   Status On-going           OT Long Term Goals - 01/16/15 0913    OT LONG TERM GOAL #1   Title Patient will return to prior level of independence with all B/IADLs and leisure activities, using right arm as dominant.   Status On-going   OT LONG TERM GOAL #2   Title Patient will have normal range in her right shoulder for increased ability to reach into overhead cabinets.   Status On-going   OT LONG TERM GOAL #3   Title Patient will have 5/5 strength in mid trapezius and improved scapular/postural stability while completing functional activities.    Status On-going   OT LONG TERM GOAL #4   Title Patient will have 2/10 pain or less in her right shoulder when completing functional activities.    Status On-going   OT LONG TERM GOAL #5   Title Patient will have trace fascial restrictions in her right shoulder region for greater mobility in right shoulder during functional activities.    Status On-going               Plan - 01/23/15 0939    Clinical Impression Statement A: Patient requested some guidance regarding use of weight at the gym. Pt states she typically uses 2# hand weights the gym and the stationary machines (lowest weight is 10#).  Reviewed form and technique while using 2# weights. patient only required min VC. Overall, patient had no complaints of pain. Recommended that patient begin to use 2# hand weights and 10# on machines. Patient was given an updated HEP  with shoulder stretches to complete prior and follow exercises.    Plan P: Continue with 2# hand weights. Add to prone exercises if able to tolerate. Add ball on the wall. Increase to green theraband with scapular exercises.         Problem List Patient Active Problem List   Diagnosis Date Noted  .  Rectal bleeding 08/05/2014  . H. pylori infection 02/08/2014  . IBS (irritable bowel syndrome) 06/20/2012  . Diarrhea 05/12/2012  . Dysphagia, pharyngoesophageal phase 05/12/2012  . GERD (gastroesophageal reflux disease) 05/12/2012  . Incontinent of feces 05/12/2012    Ailene Ravel, OTR/L,CBIS  [phone]  01/23/2015, 9:50 AM  [facility] [address] [place], [place], [address] Phone: [phone]   Fax:  [phone]  Name: Michele Villa MRN: [id number] Date of Birth: 01-12-1946

## 2015-01-25 ENCOUNTER — Encounter (HOSPITAL_COMMUNITY): Payer: Self-pay | Admitting: Occupational Therapy

## 2015-01-25 ENCOUNTER — Ambulatory Visit (HOSPITAL_COMMUNITY): Payer: 59 | Admitting: Occupational Therapy

## 2015-01-25 DIAGNOSIS — R29898 Other symptoms and signs involving the musculoskeletal system: Secondary | ICD-10-CM

## 2015-01-25 DIAGNOSIS — M25611 Stiffness of right shoulder, not elsewhere classified: Secondary | ICD-10-CM

## 2015-01-25 DIAGNOSIS — M25511 Pain in right shoulder: Secondary | ICD-10-CM

## 2015-01-25 NOTE — Therapy (Signed)
[facility] [address] [place], [place], [address] Phone: [phone]   Fax:  [phone]  Occupational Therapy Reassessment and Discharge Summary  Patient Details  Name: Michele Villa MRN: [id number] Date of Birth: [birth year] Referring Provider: Dr. Fara Chute  Encounter Date: 01/25/2015      OT End of Session - 01/25/15 1209    Visit Number 5   Number of Visits 8   Date for OT Re-Evaluation 03/10/15  mini reassess on 02/07/15   Authorization Type UHC and Medicare part A   Authorization Time Period sixty visits per year authorized   Authorization - Visit Number 5   Authorization - Number of Visits 60   OT Start Time 0930   OT Stop Time 1015   OT Time Calculation (min) 45 min   Activity Tolerance Patient tolerated treatment well   Behavior During Therapy [facility] for tasks assessed/performed      Past Medical History  Diagnosis Date  . IBS (irritable bowel syndrome)   . Hyperlipidemia   . GERD (gastroesophageal reflux disease)   . H. pylori infection 2012    treated by Dr Neita Carp  . Arthritis     neck, shoulder and knees  . Hypercholesterolemia     Past Surgical History  Procedure Laterality Date  . Colonoscopy  11/15/2002    RMR:Two anal papilla; otherwise, normal rectum, normal colon normal terminal ileum  . Tubal ligation    . Colonoscopy N/A 05/25/2012    Dr. Rourk:Shallow left sided colonic diverticulosis. Minimally hemorrhoids. Status post segmental biopsy, benign  . Esophagogastroduodenoscopy (egd) with esophageal dilation N/A 05/25/2012    Dr. Darnelle Going reflux esophagitis/Hiatal hernia, soft distal peptic stricture status post dilation. Small bowel biopsy benign.  . Esophageal biopsy N/A 05/25/2012    Procedure: BIOPSY;  Surgeon: Corbin Ade, MD;  [facility];  Service: Endoscopy;  Laterality: N/A;      There were no vitals filed for this visit.  Visit Diagnosis:  Stiffness of right shoulder  joint  Shoulder weakness  Pain in joint of right shoulder      Subjective Assessment - 01/25/15 0929    Subjective  S: It was sore after last time, but both arms were.    Currently in Pain? Yes   Pain Score 2    Pain Location Shoulder   Pain Orientation Right   Pain Descriptors / Indicators Aching   Pain Type Acute pain           [facility] OT Assessment - 01/25/15 0929    Assessment   Diagnosis Right Shoulder Bursitis   Precautions   Precautions None   Palpation   Palpation comment minimal fascial restrictions in her right upper and mid trapezius, supraspinatus, and upper arm region, rhomboids are also tight   AROM   Overall AROM Comments assessed in seated, ER/IR with shoulder abducted to 90   AROM Assessment Site Shoulder   Right/Left Shoulder Right   Right Shoulder Flexion 174 Degrees  130 previous   Right Shoulder ABduction 179 Degrees  170 previous   Right Shoulder Internal Rotation 90 Degrees  same as previous   Right Shoulder External Rotation 80 Degrees  same as previous   PROM   Overall PROM Comments PROM is WNL for all shoulder ranges assessed this date.   Strength   Overall Strength Comments assessed upper, mid, and lower trapezius strength - WNL except for mid trap is 4+/5.  Subscapularis strength is 5/5  Strength Assessment Site Shoulder   Right/Left Shoulder Right   Right Shoulder Flexion 5/5  same as previous   Right Shoulder Extension 5/5  same as previous   Right Shoulder ABduction 5/5  same as previous   Right Shoulder Internal Rotation 5/5  same as previous   Right Shoulder External Rotation 5/5  same as previous                  OT Treatments/Exercises (OP) - 01/25/15 0933    Exercises   Exercises Shoulder   Shoulder Exercises: Supine   Protraction PROM;5 reps;Strengthening;15 reps   Protraction Weight (lbs) 2   Horizontal ABduction PROM;5 reps;Strengthening;15 reps   Horizontal ABduction Weight (lbs) 2   External Rotation  PROM;5 reps;Strengthening;15 reps   External Rotation Weight (lbs) 2   Internal Rotation PROM;5 reps;Strengthening;15 reps   Internal Rotation Weight (lbs) 2   Flexion PROM;5 reps;Strengthening;15 reps   Shoulder Flexion Weight (lbs) 2   ABduction PROM;5 reps;Strengthening;15 reps   Shoulder ABduction Weight (lbs) 2   Shoulder Exercises: Standing   Protraction Strengthening;15 reps   Protraction Weight (lbs) 2   Horizontal ABduction Strengthening;12 reps   Horizontal ABduction Weight (lbs) 2   External Rotation Strengthening;15 reps   External Rotation Weight (lbs) 2   Internal Rotation Strengthening;15 reps   Internal Rotation Weight (lbs) 2   Flexion Strengthening;15 reps   Shoulder Flexion Weight (lbs) 2   ABduction Strengthening;15 reps   Shoulder ABduction Weight (lbs) 2   Other Standing Exercises green theraband strengthening exercises: horizontal abduction, IR/ER, PNF patterns, extension. 10X each   Shoulder Exercises: ROM/Strengthening   Ball on Wall 1' flexion   Manual Therapy   Manual Therapy Myofascial release   Myofascial Release Myofascial release and manual stretching to right upper arm, trapezius, and scapularis region to decrease fascial restrictions and increase joint mobility in a pain free zone.                OT Education - 01/25/15 0957    Education provided Yes   Education Details green theraband strengthening exercises   Person(s) Educated Patient   Methods Explanation;Demonstration;Handout   Comprehension Verbalized understanding;Returned demonstration          OT Short Term Goals - 01/25/15 1006    OT SHORT TERM GOAL #1   Title Patient will be educated on a HEP to decrease pain and improve mobility and strength in her right shoulder.   Status Achieved   OT SHORT TERM GOAL #2   Title Patient will improve right shoulder flexion to 150 or better for increased ability to reach into overhead cabinet.   Status Achieved   OT SHORT TERM GOAL #3    Title Patient will improve mid trapezius strength to 4/5 for increased ability to maintain retracted scapular position at rest.   Status Achieved   OT SHORT TERM GOAL #4   Title Patient will have 4/10 pain or better when drying her hair.   Status Achieved   OT SHORT TERM GOAL #5   Title Patient will have min fascial restrictions in her scapular and trapezius region for improved ability to use right arm functionally.   Status Achieved           OT Long Term Goals - 01/25/15 1008    OT LONG TERM GOAL #1   Title Patient will return to prior level of independence with all B/IADLs and leisure activities, using right arm as dominant.   Status Achieved  OT LONG TERM GOAL #2   Title Patient will have normal range in her right shoulder for increased ability to reach into overhead cabinets.   Status Achieved   OT LONG TERM GOAL #3   Title Patient will have 5/5 strength in mid trapezius and improved scapular/postural stability while completing functional activities.    Status Not Met   OT LONG TERM GOAL #4   Title Patient will have 2/10 pain or less in her right shoulder when completing functional activities.    Status Achieved   OT LONG TERM GOAL #5   Title Patient will have trace fascial restrictions in her right shoulder region for greater mobility in right shoulder during functional activities.    Status Not Met               Plan - 01/25/15 1209    Clinical Impression Statement A: Pt requested today be her last session, as she is pleased with her functional level and feels that she can continue on her own at home. Pt reassessed, has met all STGs and 3/5 LTGg. Pt has made great improvement in pain level and range of motion. Pt provided with and educated on green theraband strengthening exercises. Pt is agreeable to discharge.    Plan P: Discharge        Problem List Patient Active Problem List   Diagnosis Date Noted  . Rectal bleeding 08/05/2014  . H. pylori infection  02/08/2014  . IBS (irritable bowel syndrome) 06/20/2012  . Diarrhea 05/12/2012  . Dysphagia, pharyngoesophageal phase 05/12/2012  . GERD (gastroesophageal reflux disease) 05/12/2012  . Incontinent of feces 05/12/2012    Guadelupe Sabin, OTR/L  [phone]  01/25/2015, 12:14 PM  [facility] [address] [place], [place], [address] Phone: [phone]   Fax:  [phone]  Name: Michele Villa MRN: [id number] Date of Birth: 1945-08-23    OCCUPATIONAL THERAPY DISCHARGE SUMMARY  Visits from Start of Care: 5  Current functional level related to goals / functional outcomes: See goals above. Pt reports she is now able to complete all the daily and leisure tasks she wants to, with little to no pain.    Remaining deficits: Pt continues to have minimal pain when moving arm in horizontal abduction or if she sleeps on it.    Education / Equipment: Provided pt with green theraband strengthening exercises, as well as reviewed current strengthening exercises and stretches.  Plan: Patient agrees to discharge.  Patient goals were met. Patient is being discharged due to being pleased with the current functional level.  ?????

## 2015-01-25 NOTE — Patient Instructions (Signed)
Strengthening: Chest Pull - Resisted   Hold Theraband in front of body with hands about shoulder width a part. Pull band a part and back together slowly. Repeat __15__ times. Complete _1___ set(s) per session.. Repeat _1-2___ session(s) per day.  http://orth.exer.us/926   Copyright  VHI. All rights reserved.   PNF Strengthening: Resisted   Standing with resistive band around each hand, bring right arm up and away, thumb back. Repeat _15___ times per set. Do __1__ sets per session. Do _1-2___ sessions per day.  http://orth.exer.us/918   Copyright  VHI. All rights reserved.   PNF Strengthening: Resisted   Standing with resistive band around each hand, bring right arm up and across body. Repeat __15__ times per set. Do ___1_ sets per session. Do __1-2__ sessions per day.  http://orth.exer.us/920   Copyright  VHI. All rights reserved.    Resisted External Rotation: in Neutral - Bilateral   Sit or stand, tubing in both hands, elbows at sides, bent to 90, forearms forward. Pinch shoulder blades together and rotate forearms out. Keep elbows at sides. Repeat __15__ times per set. Do __1__ sets per session. Do _1-2___ sessions per day.  http://orth.exer.us/966   Copyright  VHI. All rights reserved.   PNF Strengthening: Resisted   Standing, hold resistive band above head. Bring right arm down and out from side. Repeat _15___ times per set. Do __1__ sets per session. Do _1-2___ sessions per day.  http://orth.exer.us/922   Copyright  VHI. All rights reserved.   

## 2015-01-30 ENCOUNTER — Encounter (HOSPITAL_COMMUNITY): Payer: 59

## 2015-02-01 ENCOUNTER — Encounter (HOSPITAL_COMMUNITY): Payer: 59 | Admitting: Specialist

## 2015-02-06 ENCOUNTER — Encounter (HOSPITAL_COMMUNITY): Payer: 59

## 2015-02-08 ENCOUNTER — Encounter (HOSPITAL_COMMUNITY): Payer: 59

## 2015-03-07 ENCOUNTER — Telehealth: Payer: Self-pay | Admitting: Internal Medicine

## 2015-03-07 NOTE — Telephone Encounter (Signed)
Routing to the refill box. 

## 2015-03-07 NOTE — Telephone Encounter (Signed)
Please clarify. We stopped Dexilant and started on Protonix 01/2014. Both medications are still in her med list. Which one is she on and make sure we know dose and that she is not taking both.

## 2015-03-07 NOTE — Telephone Encounter (Signed)
PLEASE CALL PATIENT REGARDING [place] [facility]    [phone]

## 2015-03-08 MED ORDER — DEXLANSOPRAZOLE 60 MG PO CPDR
1.0000 | DELAYED_RELEASE_CAPSULE | Freq: Every day | ORAL | Status: DC
Start: 2015-03-08 — End: 2016-03-07

## 2015-03-08 NOTE — Telephone Encounter (Signed)
Pt said the protonix did not work well. She would like to go back on dexilant.

## 2015-03-08 NOTE — Addendum Note (Signed)
Addended by: Mahala Menghini on: 03/08/2015 11:12 AM   Modules accepted: Orders, Medications

## 2015-04-22 ENCOUNTER — Telehealth: Payer: Self-pay

## 2015-04-22 ENCOUNTER — Other Ambulatory Visit: Payer: Self-pay | Admitting: Gastroenterology

## 2015-04-22 MED ORDER — RABEPRAZOLE SODIUM 20 MG PO TBEC: 20.0000 mg | DELAYED_RELEASE_TABLET | Freq: Every day | ORAL | Status: DC

## 2015-04-22 NOTE — Telephone Encounter (Signed)
I sent in Aciphex.

## 2015-04-22 NOTE — Telephone Encounter (Signed)
Pt called- her insurance is not paying as much for the dexilant as it was before. She is wanting a new medication sent in. She has tried: protonix, omeprazole, prevacid and none of them work well. She was wondering if she could try aciphex?  Pt uses [facility].

## 2015-06-21 DIAGNOSIS — R7301 Impaired fasting glucose: Secondary | ICD-10-CM | POA: Diagnosis not present

## 2015-06-21 DIAGNOSIS — E782 Mixed hyperlipidemia: Secondary | ICD-10-CM | POA: Diagnosis not present

## 2015-06-21 DIAGNOSIS — K210 Gastro-esophageal reflux disease with esophagitis: Secondary | ICD-10-CM | POA: Diagnosis not present

## 2015-06-21 DIAGNOSIS — E7800 Pure hypercholesterolemia, unspecified: Secondary | ICD-10-CM | POA: Diagnosis not present

## 2015-06-25 DIAGNOSIS — K210 Gastro-esophageal reflux disease with esophagitis: Secondary | ICD-10-CM | POA: Diagnosis not present

## 2015-06-25 DIAGNOSIS — R7301 Impaired fasting glucose: Secondary | ICD-10-CM | POA: Diagnosis not present

## 2015-06-25 DIAGNOSIS — K580 Irritable bowel syndrome with diarrhea: Secondary | ICD-10-CM | POA: Diagnosis not present

## 2015-06-25 DIAGNOSIS — E782 Mixed hyperlipidemia: Secondary | ICD-10-CM | POA: Diagnosis not present

## 2015-06-25 DIAGNOSIS — M67471 Ganglion, right ankle and foot: Secondary | ICD-10-CM | POA: Diagnosis not present

## 2015-06-25 DIAGNOSIS — K5730 Diverticulosis of large intestine without perforation or abscess without bleeding: Secondary | ICD-10-CM | POA: Diagnosis not present

## 2015-11-11 DIAGNOSIS — R300 Dysuria: Secondary | ICD-10-CM | POA: Diagnosis not present

## 2015-11-11 DIAGNOSIS — M545 Low back pain: Secondary | ICD-10-CM | POA: Diagnosis not present

## 2015-11-11 DIAGNOSIS — M19042 Primary osteoarthritis, left hand: Secondary | ICD-10-CM | POA: Diagnosis not present

## 2015-11-11 DIAGNOSIS — Z6824 Body mass index (BMI) 24.0-24.9, adult: Secondary | ICD-10-CM | POA: Diagnosis not present

## 2015-11-11 DIAGNOSIS — M19041 Primary osteoarthritis, right hand: Secondary | ICD-10-CM | POA: Diagnosis not present

## 2016-01-13 DIAGNOSIS — E782 Mixed hyperlipidemia: Secondary | ICD-10-CM | POA: Diagnosis not present

## 2016-01-13 DIAGNOSIS — K210 Gastro-esophageal reflux disease with esophagitis: Secondary | ICD-10-CM | POA: Diagnosis not present

## 2016-01-13 DIAGNOSIS — E7800 Pure hypercholesterolemia, unspecified: Secondary | ICD-10-CM | POA: Diagnosis not present

## 2016-01-13 DIAGNOSIS — R7301 Impaired fasting glucose: Secondary | ICD-10-CM | POA: Diagnosis not present

## 2016-01-13 DIAGNOSIS — K5730 Diverticulosis of large intestine without perforation or abscess without bleeding: Secondary | ICD-10-CM | POA: Diagnosis not present

## 2016-01-16 DIAGNOSIS — R7301 Impaired fasting glucose: Secondary | ICD-10-CM | POA: Diagnosis not present

## 2016-01-16 DIAGNOSIS — K210 Gastro-esophageal reflux disease with esophagitis: Secondary | ICD-10-CM | POA: Diagnosis not present

## 2016-01-16 DIAGNOSIS — Z23 Encounter for immunization: Secondary | ICD-10-CM | POA: Diagnosis not present

## 2016-01-16 DIAGNOSIS — Z1389 Encounter for screening for other disorder: Secondary | ICD-10-CM | POA: Diagnosis not present

## 2016-01-16 DIAGNOSIS — Z6824 Body mass index (BMI) 24.0-24.9, adult: Secondary | ICD-10-CM | POA: Diagnosis not present

## 2016-01-16 DIAGNOSIS — K580 Irritable bowel syndrome with diarrhea: Secondary | ICD-10-CM | POA: Diagnosis not present

## 2016-01-16 DIAGNOSIS — E782 Mixed hyperlipidemia: Secondary | ICD-10-CM | POA: Diagnosis not present

## 2016-01-16 DIAGNOSIS — K5730 Diverticulosis of large intestine without perforation or abscess without bleeding: Secondary | ICD-10-CM | POA: Diagnosis not present

## 2016-01-22 DIAGNOSIS — D485 Neoplasm of uncertain behavior of skin: Secondary | ICD-10-CM | POA: Diagnosis not present

## 2016-01-22 DIAGNOSIS — Z85828 Personal history of other malignant neoplasm of skin: Secondary | ICD-10-CM | POA: Diagnosis not present

## 2016-01-22 DIAGNOSIS — L570 Actinic keratosis: Secondary | ICD-10-CM | POA: Diagnosis not present

## 2016-01-22 DIAGNOSIS — L820 Inflamed seborrheic keratosis: Secondary | ICD-10-CM | POA: Diagnosis not present

## 2016-01-28 DIAGNOSIS — B351 Tinea unguium: Secondary | ICD-10-CM | POA: Diagnosis not present

## 2016-01-28 DIAGNOSIS — M79673 Pain in unspecified foot: Secondary | ICD-10-CM | POA: Diagnosis not present

## 2016-02-24 DIAGNOSIS — H1045 Other chronic allergic conjunctivitis: Secondary | ICD-10-CM | POA: Diagnosis not present

## 2016-02-24 DIAGNOSIS — H2513 Age-related nuclear cataract, bilateral: Secondary | ICD-10-CM | POA: Diagnosis not present

## 2016-02-24 DIAGNOSIS — H5203 Hypermetropia, bilateral: Secondary | ICD-10-CM | POA: Diagnosis not present

## 2016-02-24 DIAGNOSIS — H01009 Unspecified blepharitis unspecified eye, unspecified eyelid: Secondary | ICD-10-CM | POA: Diagnosis not present

## 2016-03-07 ENCOUNTER — Other Ambulatory Visit: Payer: Self-pay | Admitting: Gastroenterology

## 2016-05-12 DIAGNOSIS — L570 Actinic keratosis: Secondary | ICD-10-CM | POA: Diagnosis not present

## 2016-05-12 DIAGNOSIS — D485 Neoplasm of uncertain behavior of skin: Secondary | ICD-10-CM | POA: Diagnosis not present

## 2016-05-12 DIAGNOSIS — Z85828 Personal history of other malignant neoplasm of skin: Secondary | ICD-10-CM | POA: Diagnosis not present

## 2016-05-21 DIAGNOSIS — C44321 Squamous cell carcinoma of skin of nose: Secondary | ICD-10-CM | POA: Diagnosis not present

## 2016-06-18 DIAGNOSIS — R7301 Impaired fasting glucose: Secondary | ICD-10-CM | POA: Diagnosis not present

## 2016-06-18 DIAGNOSIS — E7800 Pure hypercholesterolemia, unspecified: Secondary | ICD-10-CM | POA: Diagnosis not present

## 2016-06-18 DIAGNOSIS — K210 Gastro-esophageal reflux disease with esophagitis: Secondary | ICD-10-CM | POA: Diagnosis not present

## 2016-06-18 DIAGNOSIS — E782 Mixed hyperlipidemia: Secondary | ICD-10-CM | POA: Diagnosis not present

## 2016-06-22 DIAGNOSIS — Z6823 Body mass index (BMI) 23.0-23.9, adult: Secondary | ICD-10-CM | POA: Diagnosis not present

## 2016-06-22 DIAGNOSIS — K580 Irritable bowel syndrome with diarrhea: Secondary | ICD-10-CM | POA: Diagnosis not present

## 2016-06-22 DIAGNOSIS — M67471 Ganglion, right ankle and foot: Secondary | ICD-10-CM | POA: Diagnosis not present

## 2016-06-22 DIAGNOSIS — E782 Mixed hyperlipidemia: Secondary | ICD-10-CM | POA: Diagnosis not present

## 2016-06-22 DIAGNOSIS — R7301 Impaired fasting glucose: Secondary | ICD-10-CM | POA: Diagnosis not present

## 2016-06-22 DIAGNOSIS — K210 Gastro-esophageal reflux disease with esophagitis: Secondary | ICD-10-CM | POA: Diagnosis not present

## 2016-06-22 DIAGNOSIS — K5730 Diverticulosis of large intestine without perforation or abscess without bleeding: Secondary | ICD-10-CM | POA: Diagnosis not present

## 2016-07-10 DIAGNOSIS — R05 Cough: Secondary | ICD-10-CM | POA: Diagnosis not present

## 2016-10-13 ENCOUNTER — Other Ambulatory Visit: Payer: Self-pay

## 2016-12-21 IMAGING — CT CT ABD-PELV W/ CM
2 of 4 series · 17 of 46 positions shown, 19 images · IV contrast (Omnipaque 300)
Comparison: None.

CLINICAL DATA: Rectal bleeding

EXAM:
CT ABDOMEN AND PELVIS WITH CONTRAST
TECHNIQUE: Multidetector CT imaging of the abdomen and pelvis was performed
using the standard protocol following bolus administration of
intravenous contrast.
CONTRAST:  100mL OMNIPAQUE IOHEXOL 300 MG/ML  SOLN

[Series 2: abd_pel_with 5.0 b40f · axial · 0.70mm/px · z∈[-694,-294]mm · 14 of 90 slices shown, 16 images]
[im 5/90  soft-tissue]
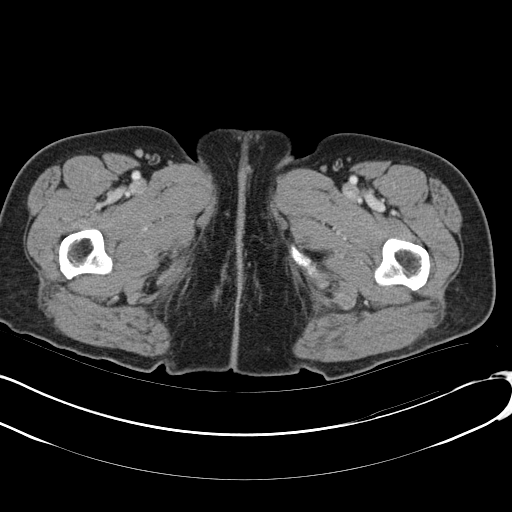
[im 5/90  bone]
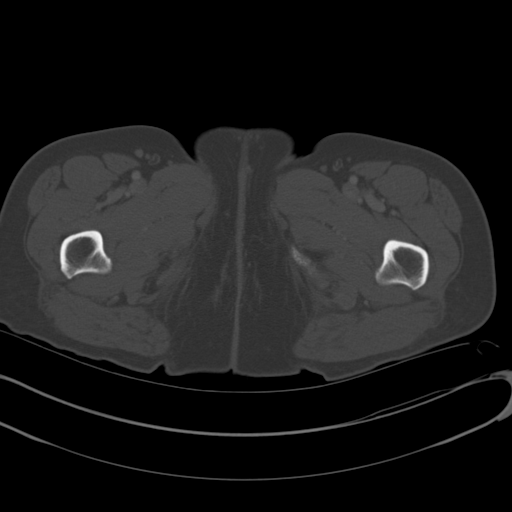
[im 13/90  soft-tissue]
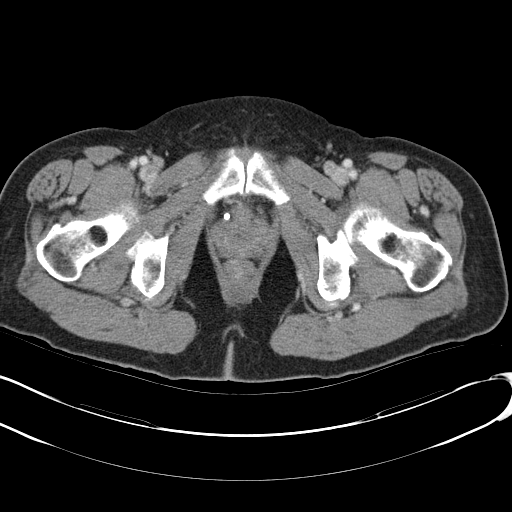
[im 17/90  soft-tissue]
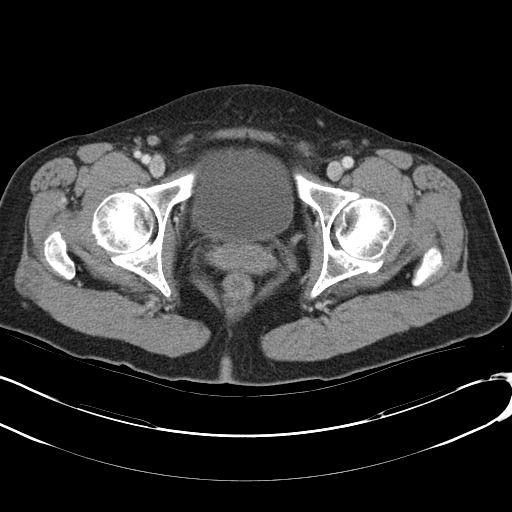
[im 26/90  soft-tissue]
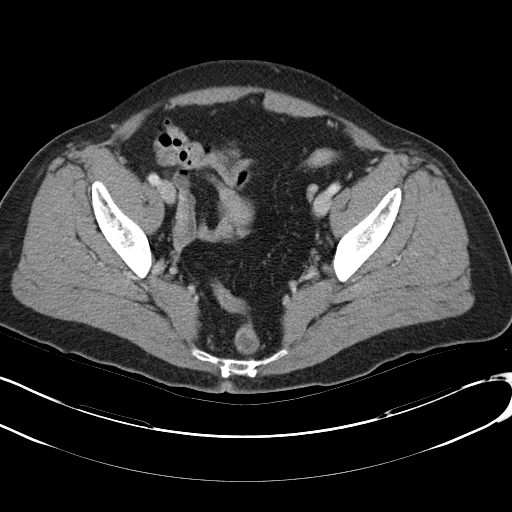
[im 30/90  soft-tissue]
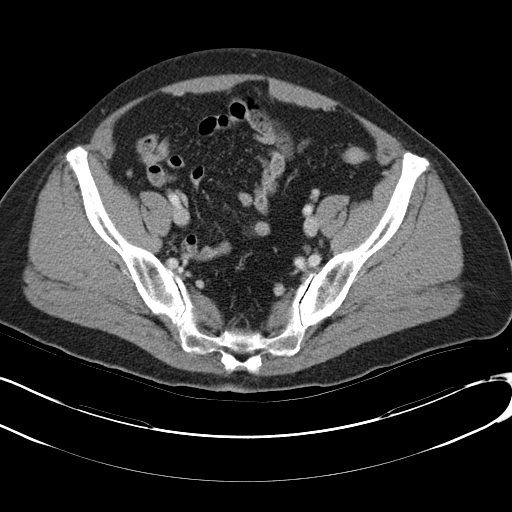
[im 34/90  soft-tissue]
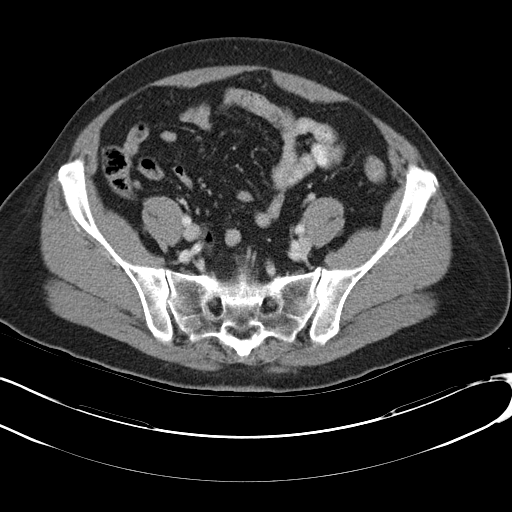
[im 43/90  soft-tissue]
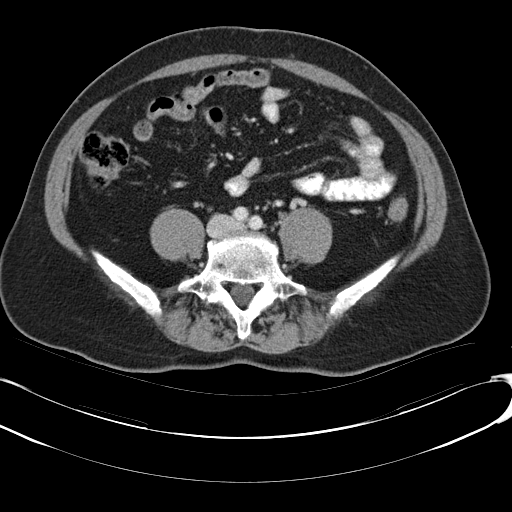
[im 47/90  soft-tissue]
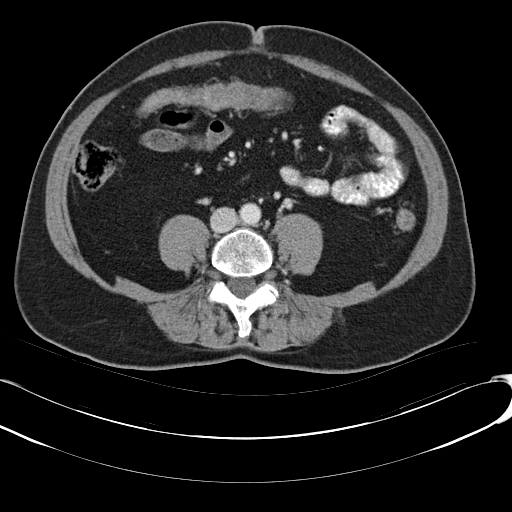
[im 56/90  soft-tissue]
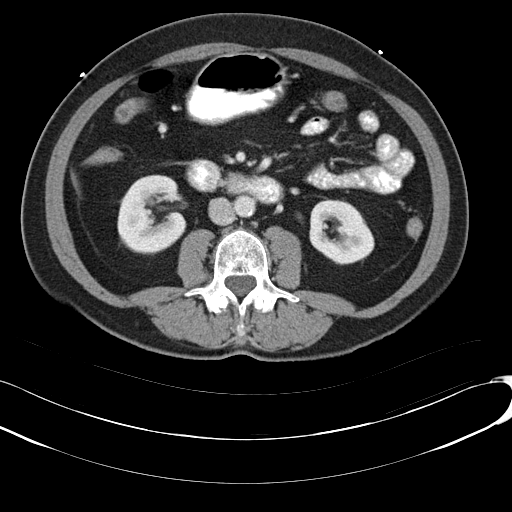
[im 56/90  bone]
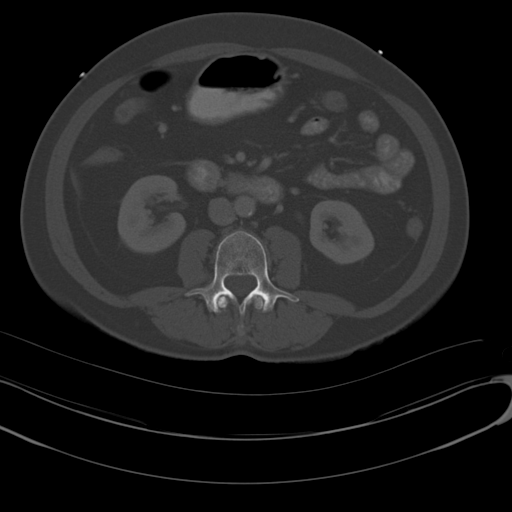
[im 60/90  soft-tissue]
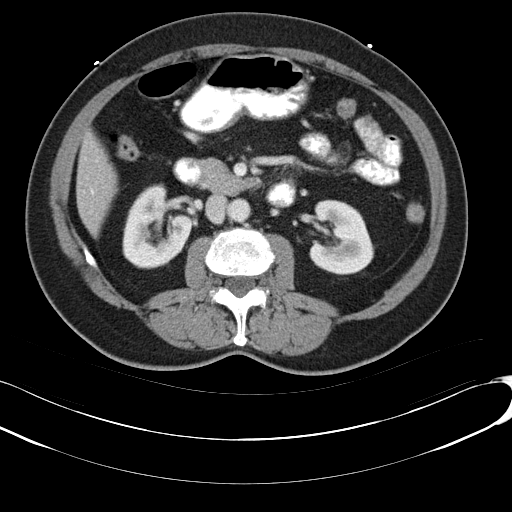
[im 68/90  soft-tissue]
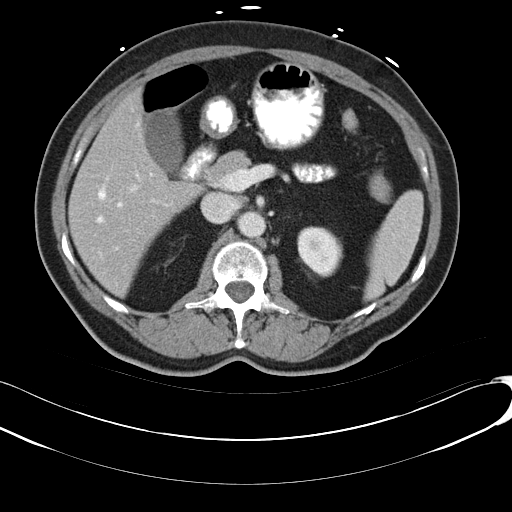
[im 73/90  soft-tissue]
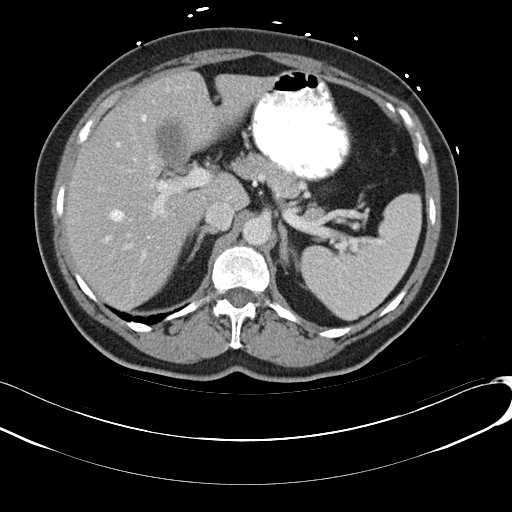
[im 77/90  soft-tissue]
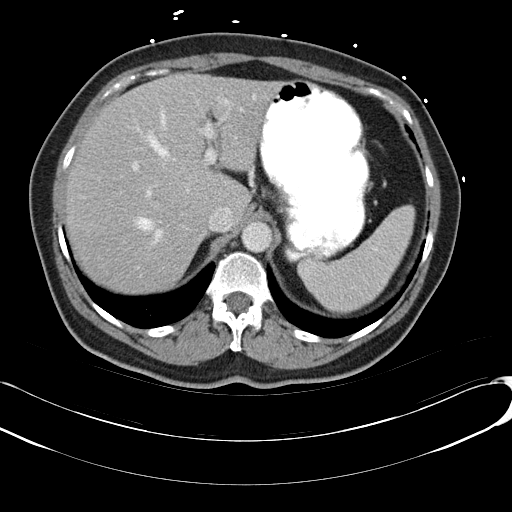
[im 85/90  soft-tissue]
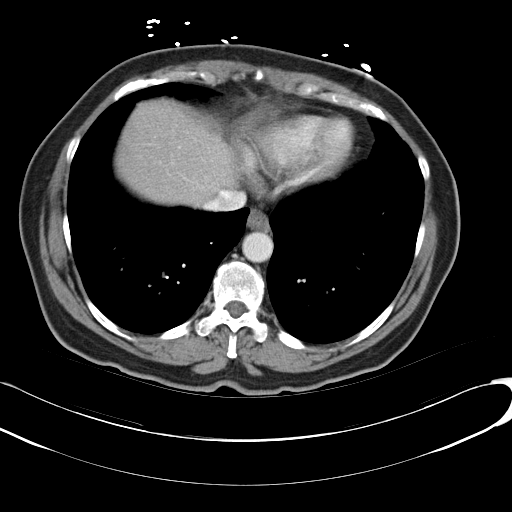

[Series 3: abd_pel_with 3.0 spo cor · coronal · 0.75mm/px · 3 of 83 slices shown]
[im 28/83  soft-tissue]
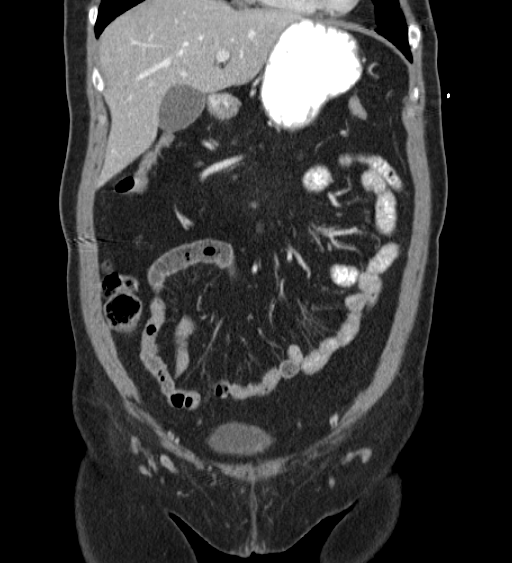
[im 37/83  soft-tissue]
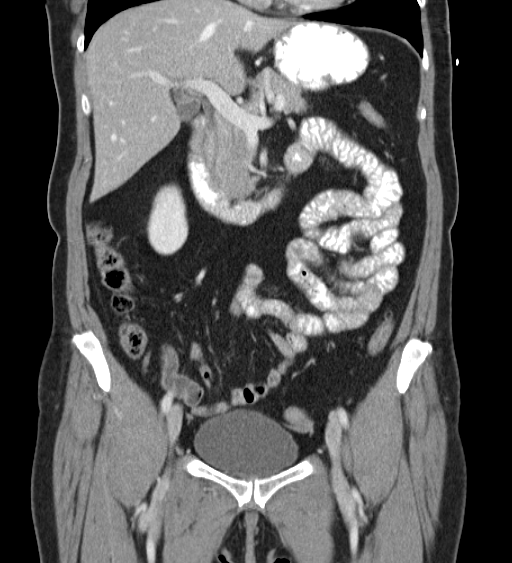
[im 46/83  soft-tissue]
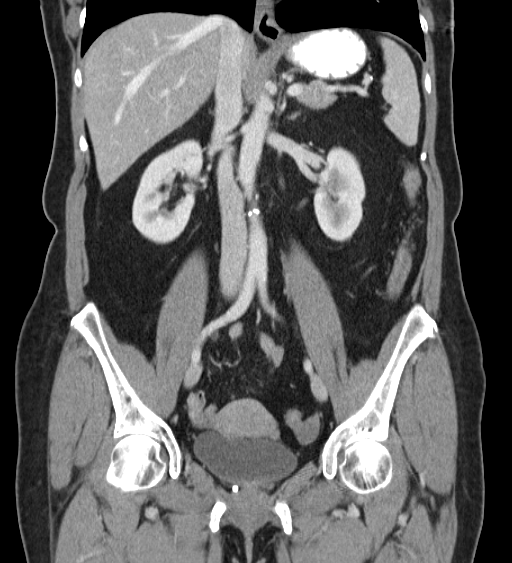

[17 of 46 positions shown; findings below may reference images not displayed]

FINDINGS: Lung bases are clear. No infiltrate or effusion. Heart size normal.

Liver gallbladder and bile ducts are normal. No focal liver lesion.
Spleen is normal in size without focal lesion. Pancreas is normal
without edema or mass

Normal kidneys and adrenal glands. No renal mass or obstruction or
stone. Urinary bladder is normal. 1 cm uterine fibroid. No adnexal
mass.

Negative for bowel obstruction. Normal appendix. Transverse and left
colon are collapsed without acute edema. Sigmoid diverticulosis. No
evidence of diverticulitis. Mild edema and enhancement of the rectum
may represent proctitis.

No free fluid. Negative for mass or adenopathy. No acute skeletal
abnormality. Negative for abdominal aortic aneurysm.
IMPRESSION: Sigmoid diverticulosis without diverticulitis. Mild thickening of
the rectum which may reflect proctitis. Negative for bowel
obstruction

Normal appendix

## 2017-01-20 DIAGNOSIS — Z85828 Personal history of other malignant neoplasm of skin: Secondary | ICD-10-CM | POA: Diagnosis not present

## 2017-01-20 DIAGNOSIS — L570 Actinic keratosis: Secondary | ICD-10-CM | POA: Diagnosis not present

## 2017-01-20 DIAGNOSIS — L821 Other seborrheic keratosis: Secondary | ICD-10-CM | POA: Diagnosis not present

## 2017-01-28 DIAGNOSIS — K210 Gastro-esophageal reflux disease with esophagitis: Secondary | ICD-10-CM | POA: Diagnosis not present

## 2017-01-28 DIAGNOSIS — Z0001 Encounter for general adult medical examination with abnormal findings: Secondary | ICD-10-CM | POA: Diagnosis not present

## 2017-01-28 DIAGNOSIS — E7800 Pure hypercholesterolemia, unspecified: Secondary | ICD-10-CM | POA: Diagnosis not present

## 2017-01-28 DIAGNOSIS — K580 Irritable bowel syndrome with diarrhea: Secondary | ICD-10-CM | POA: Diagnosis not present

## 2017-01-28 DIAGNOSIS — Z6824 Body mass index (BMI) 24.0-24.9, adult: Secondary | ICD-10-CM | POA: Diagnosis not present

## 2017-01-28 DIAGNOSIS — R7301 Impaired fasting glucose: Secondary | ICD-10-CM | POA: Diagnosis not present

## 2017-01-28 DIAGNOSIS — E782 Mixed hyperlipidemia: Secondary | ICD-10-CM | POA: Diagnosis not present

## 2017-02-01 DIAGNOSIS — M67471 Ganglion, right ankle and foot: Secondary | ICD-10-CM | POA: Diagnosis not present

## 2017-02-01 DIAGNOSIS — K580 Irritable bowel syndrome with diarrhea: Secondary | ICD-10-CM | POA: Diagnosis not present

## 2017-02-01 DIAGNOSIS — R7301 Impaired fasting glucose: Secondary | ICD-10-CM | POA: Diagnosis not present

## 2017-02-01 DIAGNOSIS — Z6823 Body mass index (BMI) 23.0-23.9, adult: Secondary | ICD-10-CM | POA: Diagnosis not present

## 2017-02-01 DIAGNOSIS — K210 Gastro-esophageal reflux disease with esophagitis: Secondary | ICD-10-CM | POA: Diagnosis not present

## 2017-02-01 DIAGNOSIS — E782 Mixed hyperlipidemia: Secondary | ICD-10-CM | POA: Diagnosis not present

## 2017-02-01 DIAGNOSIS — K5730 Diverticulosis of large intestine without perforation or abscess without bleeding: Secondary | ICD-10-CM | POA: Diagnosis not present

## 2017-02-01 DIAGNOSIS — Z23 Encounter for immunization: Secondary | ICD-10-CM | POA: Diagnosis not present

## 2017-03-29 ENCOUNTER — Other Ambulatory Visit: Payer: Self-pay | Admitting: Gastroenterology

## 2017-03-30 NOTE — Telephone Encounter (Signed)
Refill X 1. Needs ov. Last seen in 2016.

## 2017-03-30 NOTE — Telephone Encounter (Signed)
Noted  

## 2017-03-31 ENCOUNTER — Encounter: Payer: Self-pay | Admitting: Internal Medicine

## 2017-04-27 ENCOUNTER — Ambulatory Visit: Payer: 59 | Admitting: Gastroenterology

## 2017-09-04 ENCOUNTER — Other Ambulatory Visit: Payer: Self-pay | Admitting: Gastroenterology

## 2017-09-27 DIAGNOSIS — E7800 Pure hypercholesterolemia, unspecified: Secondary | ICD-10-CM | POA: Diagnosis not present

## 2017-09-27 DIAGNOSIS — K210 Gastro-esophageal reflux disease with esophagitis: Secondary | ICD-10-CM | POA: Diagnosis not present

## 2017-09-27 DIAGNOSIS — E782 Mixed hyperlipidemia: Secondary | ICD-10-CM | POA: Diagnosis not present

## 2017-09-27 DIAGNOSIS — R7301 Impaired fasting glucose: Secondary | ICD-10-CM | POA: Diagnosis not present

## 2017-09-27 DIAGNOSIS — M545 Low back pain: Secondary | ICD-10-CM | POA: Diagnosis not present

## 2017-09-29 DIAGNOSIS — K580 Irritable bowel syndrome with diarrhea: Secondary | ICD-10-CM | POA: Diagnosis not present

## 2017-09-29 DIAGNOSIS — E782 Mixed hyperlipidemia: Secondary | ICD-10-CM | POA: Diagnosis not present

## 2017-09-29 DIAGNOSIS — K5730 Diverticulosis of large intestine without perforation or abscess without bleeding: Secondary | ICD-10-CM | POA: Diagnosis not present

## 2017-09-29 DIAGNOSIS — R7301 Impaired fasting glucose: Secondary | ICD-10-CM | POA: Diagnosis not present

## 2017-09-29 DIAGNOSIS — K210 Gastro-esophageal reflux disease with esophagitis: Secondary | ICD-10-CM | POA: Diagnosis not present

## 2017-09-29 DIAGNOSIS — Z1389 Encounter for screening for other disorder: Secondary | ICD-10-CM | POA: Diagnosis not present

## 2017-09-29 DIAGNOSIS — Z6823 Body mass index (BMI) 23.0-23.9, adult: Secondary | ICD-10-CM | POA: Diagnosis not present

## 2017-09-29 DIAGNOSIS — Z1331 Encounter for screening for depression: Secondary | ICD-10-CM | POA: Diagnosis not present

## 2017-10-05 DIAGNOSIS — Z6824 Body mass index (BMI) 24.0-24.9, adult: Secondary | ICD-10-CM | POA: Diagnosis not present

## 2017-10-05 DIAGNOSIS — R300 Dysuria: Secondary | ICD-10-CM | POA: Diagnosis not present

## 2017-10-05 DIAGNOSIS — N3000 Acute cystitis without hematuria: Secondary | ICD-10-CM | POA: Diagnosis not present

## 2018-01-05 DIAGNOSIS — H35361 Drusen (degenerative) of macula, right eye: Secondary | ICD-10-CM | POA: Diagnosis not present

## 2018-01-19 DIAGNOSIS — L570 Actinic keratosis: Secondary | ICD-10-CM | POA: Diagnosis not present

## 2018-01-19 DIAGNOSIS — C441121 Basal cell carcinoma of skin of right upper eyelid, including canthus: Secondary | ICD-10-CM | POA: Diagnosis not present

## 2018-01-19 DIAGNOSIS — C44111 Basal cell carcinoma of skin of unspecified eyelid, including canthus: Secondary | ICD-10-CM | POA: Diagnosis not present

## 2018-02-03 DIAGNOSIS — C441121 Basal cell carcinoma of skin of right upper eyelid, including canthus: Secondary | ICD-10-CM | POA: Diagnosis not present

## 2018-02-10 DIAGNOSIS — Z23 Encounter for immunization: Secondary | ICD-10-CM | POA: Diagnosis not present

## 2018-03-29 DIAGNOSIS — E7800 Pure hypercholesterolemia, unspecified: Secondary | ICD-10-CM | POA: Diagnosis not present

## 2018-03-29 DIAGNOSIS — K210 Gastro-esophageal reflux disease with esophagitis: Secondary | ICD-10-CM | POA: Diagnosis not present

## 2018-03-29 DIAGNOSIS — E782 Mixed hyperlipidemia: Secondary | ICD-10-CM | POA: Diagnosis not present

## 2018-03-30 DIAGNOSIS — K210 Gastro-esophageal reflux disease with esophagitis: Secondary | ICD-10-CM | POA: Diagnosis not present

## 2018-03-30 DIAGNOSIS — K5730 Diverticulosis of large intestine without perforation or abscess without bleeding: Secondary | ICD-10-CM | POA: Diagnosis not present

## 2018-03-30 DIAGNOSIS — K580 Irritable bowel syndrome with diarrhea: Secondary | ICD-10-CM | POA: Diagnosis not present

## 2018-03-30 DIAGNOSIS — Z6823 Body mass index (BMI) 23.0-23.9, adult: Secondary | ICD-10-CM | POA: Diagnosis not present

## 2018-03-30 DIAGNOSIS — E782 Mixed hyperlipidemia: Secondary | ICD-10-CM | POA: Diagnosis not present

## 2018-03-30 DIAGNOSIS — R7301 Impaired fasting glucose: Secondary | ICD-10-CM | POA: Diagnosis not present

## 2018-04-21 ENCOUNTER — Other Ambulatory Visit: Payer: Self-pay | Admitting: Gastroenterology

## 2018-09-07 DIAGNOSIS — Z124 Encounter for screening for malignant neoplasm of cervix: Secondary | ICD-10-CM | POA: Diagnosis not present

## 2018-09-07 DIAGNOSIS — Z01419 Encounter for gynecological examination (general) (routine) without abnormal findings: Secondary | ICD-10-CM | POA: Diagnosis not present

## 2018-09-22 ENCOUNTER — Encounter: Payer: Self-pay | Admitting: Cardiology

## 2018-09-22 DIAGNOSIS — E7800 Pure hypercholesterolemia, unspecified: Secondary | ICD-10-CM | POA: Diagnosis not present

## 2018-09-22 DIAGNOSIS — R7301 Impaired fasting glucose: Secondary | ICD-10-CM | POA: Diagnosis not present

## 2018-09-22 DIAGNOSIS — K210 Gastro-esophageal reflux disease with esophagitis: Secondary | ICD-10-CM | POA: Diagnosis not present

## 2018-09-22 DIAGNOSIS — E782 Mixed hyperlipidemia: Secondary | ICD-10-CM | POA: Diagnosis not present

## 2018-09-26 DIAGNOSIS — R7301 Impaired fasting glucose: Secondary | ICD-10-CM | POA: Diagnosis not present

## 2018-09-26 DIAGNOSIS — K580 Irritable bowel syndrome with diarrhea: Secondary | ICD-10-CM | POA: Diagnosis not present

## 2018-09-26 DIAGNOSIS — K210 Gastro-esophageal reflux disease with esophagitis: Secondary | ICD-10-CM | POA: Diagnosis not present

## 2018-09-26 DIAGNOSIS — Z6824 Body mass index (BMI) 24.0-24.9, adult: Secondary | ICD-10-CM | POA: Diagnosis not present

## 2018-09-26 DIAGNOSIS — K5730 Diverticulosis of large intestine without perforation or abscess without bleeding: Secondary | ICD-10-CM | POA: Diagnosis not present

## 2018-09-26 DIAGNOSIS — Z23 Encounter for immunization: Secondary | ICD-10-CM | POA: Diagnosis not present

## 2018-09-26 DIAGNOSIS — E782 Mixed hyperlipidemia: Secondary | ICD-10-CM | POA: Diagnosis not present

## 2018-09-26 DIAGNOSIS — Z0001 Encounter for general adult medical examination with abnormal findings: Secondary | ICD-10-CM | POA: Diagnosis not present

## 2018-09-27 DIAGNOSIS — Z1231 Encounter for screening mammogram for malignant neoplasm of breast: Secondary | ICD-10-CM | POA: Diagnosis not present

## 2018-10-03 DIAGNOSIS — R87615 Unsatisfactory cytologic smear of cervix: Secondary | ICD-10-CM | POA: Diagnosis not present

## 2019-01-13 DIAGNOSIS — Z20828 Contact with and (suspected) exposure to other viral communicable diseases: Secondary | ICD-10-CM | POA: Diagnosis not present

## 2019-01-13 DIAGNOSIS — J069 Acute upper respiratory infection, unspecified: Secondary | ICD-10-CM | POA: Diagnosis not present

## 2019-01-31 DIAGNOSIS — Z23 Encounter for immunization: Secondary | ICD-10-CM | POA: Diagnosis not present

## 2019-02-28 DIAGNOSIS — H43391 Other vitreous opacities, right eye: Secondary | ICD-10-CM | POA: Diagnosis not present

## 2019-05-25 DIAGNOSIS — Z23 Encounter for immunization: Secondary | ICD-10-CM | POA: Diagnosis not present

## 2019-06-30 DIAGNOSIS — Z23 Encounter for immunization: Secondary | ICD-10-CM | POA: Diagnosis not present

## 2019-08-14 DIAGNOSIS — M1612 Unilateral primary osteoarthritis, left hip: Secondary | ICD-10-CM | POA: Diagnosis not present

## 2019-08-14 DIAGNOSIS — R002 Palpitations: Secondary | ICD-10-CM | POA: Diagnosis not present

## 2019-08-14 DIAGNOSIS — K5790 Diverticulosis of intestine, part unspecified, without perforation or abscess without bleeding: Secondary | ICD-10-CM | POA: Diagnosis not present

## 2019-08-14 DIAGNOSIS — K589 Irritable bowel syndrome without diarrhea: Secondary | ICD-10-CM | POA: Diagnosis not present

## 2019-08-14 DIAGNOSIS — Z6824 Body mass index (BMI) 24.0-24.9, adult: Secondary | ICD-10-CM | POA: Diagnosis not present

## 2019-08-28 ENCOUNTER — Encounter: Payer: Self-pay | Admitting: *Deleted

## 2019-08-29 ENCOUNTER — Other Ambulatory Visit: Payer: Self-pay

## 2019-08-29 ENCOUNTER — Encounter: Payer: Self-pay | Admitting: Cardiology

## 2019-08-29 ENCOUNTER — Ambulatory Visit (INDEPENDENT_AMBULATORY_CARE_PROVIDER_SITE_OTHER): Payer: Medicare Other | Admitting: Cardiology

## 2019-08-29 ENCOUNTER — Encounter: Payer: Self-pay | Admitting: *Deleted

## 2019-08-29 VITALS — BP 160/80 | HR 56 | Ht 63.5 in | Wt 138.8 lb

## 2019-08-29 DIAGNOSIS — R002 Palpitations: Secondary | ICD-10-CM | POA: Diagnosis not present

## 2019-08-29 DIAGNOSIS — I493 Ventricular premature depolarization: Secondary | ICD-10-CM

## 2019-08-29 NOTE — Progress Notes (Signed)
Clinical Summary Michele Villa is a 74 y.o.female seen as new consult, referrd by Dr Quintin Alto for palpitations.   1. Palpitations - started since she had covid - fluttering since October, 2020 - fluttering can last up to 1 minutes. Mild SOB when occurs. Notices more at rest.  - tea x 2-3 glasses, decaf coffee, no sodas, no energy drinks, rare EtoH.    - pcp EKG shows SR, isolated PVC   Past Medical History:  Diagnosis Date  . Arthritis    neck, shoulder and knees  . GERD (gastroesophageal reflux disease)   . H. pylori infection 2012   treated by Dr Quintin Alto  . Hypercholesterolemia   . Hyperlipidemia   . IBS (irritable bowel syndrome)      Allergies  Allergen Reactions  . Statins Hives and Itching     Current Outpatient Medications  Medication Sig Dispense Refill  . ciprofloxacin (CIPRO) 500 MG tablet Take 1 tablet (500 mg total) by mouth 2 (two) times daily. (Patient not taking: Reported on 01/09/2015) 20 tablet 0  . DEXILANT 60 MG capsule TAKE ONE CAPSULE BY MOUTH EVERY DAY 90 capsule 3  . ezetimibe (ZETIA) 10 MG tablet Take 10 mg by mouth daily.    . Garlic 4270 MG CAPS Take by mouth.    . mesalamine (CANASA) 1000 MG suppository Place 1 suppository (1,000 mg total) rectally at bedtime. (Patient not taking: Reported on 01/09/2015) 30 suppository 1  . metroNIDAZOLE (FLAGYL) 500 MG tablet Take 1 tablet (500 mg total) by mouth 2 (two) times daily. (Patient not taking: Reported on 01/09/2015) 20 tablet 0  . Multiple Vitamins-Minerals (CENTRUM SILVER ULTRA WOMENS PO) Take by mouth.    . naproxen sodium (ANAPROX) 220 MG tablet Take 220 mg by mouth 2 (two) times daily with a meal.    . Omega-3 Fatty Acids (FISH OIL) 1200 MG CAPS Take by mouth.    . pantoprazole (PROTONIX) 40 MG tablet TAKE 1 TABLET BY MOUTH TWICE DAILY 60 tablet 1  . RABEprazole (ACIPHEX) 20 MG tablet Take 1 tablet (20 mg total) by mouth daily. 90 tablet 3   No current facility-administered medications for  this visit.     Past Surgical History:  Procedure Laterality Date  . BIOPSY N/A 05/25/2012   Procedure: BIOPSY;  Surgeon: Daneil Dolin, MD;  [facility];  Service: Endoscopy;  Laterality: N/A;    . COLONOSCOPY  11/15/2002   RMR:Two anal papilla; otherwise, normal rectum, normal colon normal terminal ileum  . COLONOSCOPY N/A 05/25/2012   Dr. Rourk:Shallow left sided colonic diverticulosis. Minimally hemorrhoids. Status post segmental biopsy, benign  . ESOPHAGOGASTRODUODENOSCOPY (EGD) WITH ESOPHAGEAL DILATION N/A 05/25/2012   Dr. Raliegh Scarlet reflux esophagitis/Hiatal hernia, soft distal peptic stricture status post dilation. Small bowel biopsy benign.  . TUBAL LIGATION       Allergies  Allergen Reactions  . Statins Hives and Itching      Family History  Problem Relation Age of Onset  . GER disease Mother   . Diabetes Brother   . CAD Other   . Lung cancer Father   . GER disease Father   . Colon cancer Neg Hx      Social History Michele Villa reports that she quit smoking about 39 years ago. Her smoking use included cigarettes. She smoked 15.00 packs per day. She does not have any smokeless tobacco history on file. Michele Villa reports no history of alcohol use.   Review of Systems CONSTITUTIONAL: No  weight loss, fever, chills, weakness or fatigue.  HEENT: Eyes: No visual loss, blurred vision, double vision or yellow sclerae.No hearing loss, sneezing, congestion, runny nose or sore throat.  SKIN: No rash or itching.  CARDIOVASCULAR: per hpi RESPIRATORY: No shortness of breath, cough or sputum.  GASTROINTESTINAL: No anorexia, nausea, vomiting or diarrhea. No abdominal pain or blood.  GENITOURINARY: No burning on urination, no polyuria NEUROLOGICAL: No headache, dizziness, syncope, paralysis, ataxia, numbness or tingling in the extremities. No change in bowel or bladder control.  MUSCULOSKELETAL: No muscle, back pain, joint pain or stiffness.  LYMPHATICS: No  enlarged nodes. No history of splenectomy.  PSYCHIATRIC: No history of depression or anxiety.  ENDOCRINOLOGIC: No reports of sweating, cold or heat intolerance. No polyuria or polydipsia.  Marland Kitchen   Physical Examination Today's Vitals   08/29/19 1043  BP: (!) 160/80  Pulse: (!) 56  SpO2: 99%  Weight: 138 lb 12.8 oz (63 kg)  Height: 5' 3.5" (1.613 m)   Body mass index is 24.2 kg/m.  Gen: resting comfortably, no acute distress HEENT: no scleral icterus, pupils equal round and reactive, no palptable cervical adenopathy,  CV: RRR, no m/r/g, no jvd Resp: Clear to auscultation bilaterally GI: abdomen is soft, non-tender, non-distended, normal bowel sounds, no hepatosplenomegaly MSK: extremities are warm, no edema.  Skin: warm, no rash Neuro:  no focal deficits Psych: appropriate affect      Assessment and Plan  1. Palpitations - EKG from pcp shows 2 isolated PVCs, unclear if cause of her symptoms and if so unclear what PVC burden she may have - obtain 24 hour holter monitor to assess symptoms and PVC burden. If high burden of NSVT would consider additioanl cardiac testing. If low burden would consider trial of beta blocker alone.  - request labs from pcp to eval K, Mg,TSH  F/u 1 month virtual   Arnoldo Lenis, M.D.

## 2019-08-29 NOTE — Patient Instructions (Signed)
Medication Instructions:  Continue all current medications.  Labwork: none  Testing/Procedures:  Your physician has recommended that you wear a 24 holter monitor. Holter monitors are medical devices that record the heart's electrical activity. Doctors most often use these monitors to diagnose arrhythmias. Arrhythmias are problems with the speed or rhythm of the heartbeat. The monitor is a small, portable device. You can wear one while you do your normal daily activities. This is usually used to diagnose what is causing palpitations/syncope (passing out).  Office will contact with results via phone or letter.    Follow-Up: 1 month   Any Other Special Instructions Will Be Listed Below (If Applicable).  If you need a refill on your cardiac medications before your next appointment, please call your pharmacy.

## 2019-09-06 DIAGNOSIS — I493 Ventricular premature depolarization: Secondary | ICD-10-CM

## 2019-09-06 DIAGNOSIS — R002 Palpitations: Secondary | ICD-10-CM | POA: Diagnosis not present

## 2019-09-26 DIAGNOSIS — Z1389 Encounter for screening for other disorder: Secondary | ICD-10-CM | POA: Diagnosis not present

## 2019-09-26 DIAGNOSIS — R7301 Impaired fasting glucose: Secondary | ICD-10-CM | POA: Diagnosis not present

## 2019-09-26 DIAGNOSIS — K580 Irritable bowel syndrome with diarrhea: Secondary | ICD-10-CM | POA: Diagnosis not present

## 2019-09-26 DIAGNOSIS — K5730 Diverticulosis of large intestine without perforation or abscess without bleeding: Secondary | ICD-10-CM | POA: Diagnosis not present

## 2019-09-26 DIAGNOSIS — E782 Mixed hyperlipidemia: Secondary | ICD-10-CM | POA: Diagnosis not present

## 2019-09-26 DIAGNOSIS — Z1331 Encounter for screening for depression: Secondary | ICD-10-CM | POA: Diagnosis not present

## 2019-09-26 DIAGNOSIS — Z23 Encounter for immunization: Secondary | ICD-10-CM | POA: Diagnosis not present

## 2019-09-26 DIAGNOSIS — Z6823 Body mass index (BMI) 23.0-23.9, adult: Secondary | ICD-10-CM | POA: Diagnosis not present

## 2019-09-27 ENCOUNTER — Ambulatory Visit (INDEPENDENT_AMBULATORY_CARE_PROVIDER_SITE_OTHER): Payer: Medicare Other

## 2019-09-27 DIAGNOSIS — I493 Ventricular premature depolarization: Secondary | ICD-10-CM

## 2019-09-28 ENCOUNTER — Encounter: Payer: Self-pay | Admitting: Cardiology

## 2019-09-28 ENCOUNTER — Encounter: Payer: Self-pay | Admitting: *Deleted

## 2019-09-28 ENCOUNTER — Telehealth (INDEPENDENT_AMBULATORY_CARE_PROVIDER_SITE_OTHER): Payer: Medicare Other | Admitting: Cardiology

## 2019-09-28 VITALS — BP 124/71 | Ht 63.5 in | Wt 139.0 lb

## 2019-09-28 DIAGNOSIS — R002 Palpitations: Secondary | ICD-10-CM | POA: Diagnosis not present

## 2019-09-28 DIAGNOSIS — I493 Ventricular premature depolarization: Secondary | ICD-10-CM

## 2019-09-28 NOTE — Patient Instructions (Signed)
Your physician recommends that you schedule a follow-up appointment in: [place] physician recommends that you continue on your current medications as directed. Please refer to the Current Medication list given to you today.  Your physician has requested that you have an echocardiogram. Echocardiography is a painless test that uses sound waves to create images of your heart. It provides your doctor with information about the size and shape of your heart and how well your heart's chambers and valves are working. This procedure takes approximately one hour. There are no restrictions for this procedure.  Thank you for choosing [facility]!!

## 2019-09-28 NOTE — Addendum Note (Signed)
Addended by: Julian Hy T on: 09/28/2019 03:48 PM   Modules accepted: Orders

## 2019-09-28 NOTE — Progress Notes (Signed)
Virtual Visit via Telephone Note   This visit type was conducted due to national recommendations for restrictions regarding the COVID-19 Pandemic (e.g. social distancing) in an effort to limit this patient's exposure and mitigate transmission in our community.  Due to her co-morbid illnesses, this patient is at least at moderate risk for complications without adequate follow up.  This format is felt to be most appropriate for this patient at this time.  The patient did not have access to video technology/had technical difficulties with video requiring transitioning to audio format only (telephone).  All issues noted in this document were discussed and addressed.  No physical exam could be performed with this format.  Please refer to the patient's chart for her  consent to telehealth for [facility].   The patient was identified using 2 identifiers.  Date:  09/28/2019   ID:  Michele Villa, DOB May 14, 1945, MRN [id number]  Patient Location: Home Provider Location: Office  PCP:  Michele Hilding, MD  Cardiologist:  Dr Carlyle Dolly MD Electrophysiologist:  None   Evaluation Performed:  Follow-Up Visit  Chief Complaint:  Follow up  History of Present Illness:    Michele Villa is a 74 y.o. female seen today for follow up of the following medical problems.    1. Palpitations - started since she had covid - fluttering since October, 2020 - fluttering can last up to 1 minutes. Mild SOB when occurs. Notices more at rest.  - tea x 2-3 glasses, decaf coffee, no sodas, no energy drinks, rare EtoH.    - pcp EKG shows SR, isolated PVC  - 09/2019 event monitor: rare PACs, frequent ventricular ectopy, short runs of NSVT up to 9 beats -symptoms less often since last visit    The patient does not have symptoms concerning for COVID-19 infection (fever, chills, cough, or new shortness of breath).    Past Medical History:  Diagnosis Date   Arthritis    neck, shoulder and knees    GERD (gastroesophageal reflux disease)    H. pylori infection 2012   treated by Dr Quintin Alto   Hypercholesterolemia    Hyperlipidemia    IBS (irritable bowel syndrome)    Past Surgical History:  Procedure Laterality Date   BIOPSY N/A 05/25/2012   Procedure: BIOPSY;  Surgeon: Daneil Dolin, MD;  [facility];  Service: Endoscopy;  Laterality: N/A;     COLONOSCOPY  11/15/2002   RMR:Two anal papilla; otherwise, normal rectum, normal colon normal terminal ileum   COLONOSCOPY N/A 05/25/2012   Dr. Rourk:Shallow left sided colonic diverticulosis. Minimally hemorrhoids. Status post segmental biopsy, benign   ESOPHAGOGASTRODUODENOSCOPY (EGD) WITH ESOPHAGEAL DILATION N/A 05/25/2012   Dr. Raliegh Scarlet reflux esophagitis/Hiatal hernia, soft distal peptic stricture status post dilation. Small bowel biopsy benign.   TUBAL LIGATION       No outpatient medications have been marked as taking for the 09/28/19 encounter (Appointment) with Arnoldo Lenis, MD.     Allergies:   Statins   Social History   Tobacco Use   Smoking status: Former Smoker    Packs/day: 15.00    Types: Cigarettes    Quit date: 03/23/1980    Years since quitting: 39.5   Smokeless tobacco: Never Used  Substance Use Topics   Alcohol use: No    Alcohol/week: 0.0 standard drinks    Comment: glass of wine with dinner twice a month   Drug use: No     Family Hx: The patient's family history  includes CAD in an other family member; Diabetes in her brother; GER disease in her father and mother; Lung cancer in her father. There is no history of Colon cancer.  ROS:   Please see the history of present illness.     All other systems reviewed and are negative.   Prior CV studies:   The following studies were reviewed today:    Labs/Other Tests and Data Reviewed:    EKG:  No ECG reviewed.  Recent Labs: No results found for requested labs within last 8760 hours.   Recent Lipid Panel No results found  for: CHOL, TRIG, HDL, CHOLHDL, LDLCALC, LDLDIRECT  Wt Readings from Last 3 Encounters:  08/29/19 138 lb 12.8 oz (63 kg)  07/26/14 139 lb 3.2 oz (63.1 kg)  07/25/14 140 lb (63.5 kg)     Objective:    Vital Signs:   Today's Vitals   09/28/19 0918  BP: 124/71  Weight: 139 lb (63 kg)  Height: 5' 3.5" (1.613 m)   Body mass index is 24.24 kg/m. Normal affect. Normal speech pattern and tone. Comfortable, no apparent distress. No audible signs of sob or wheezing.   ASSESSMENT & PLAN:    1. Palpitations - frequent PVCs on recent monitor with short runs of NSVT up to 9 beats - will obtain echo to evaluate for underlying structural hear disease as potential etiology for ventricular ectopy, if benign would plan for lexiscan - request labs from pcp she had just this week.     COVID-19 Education: The signs and symptoms of COVID-19 were discussed with the patient and how to seek care for testing (follow up with PCP or arrange E-visit).  The importance of social distancing was discussed today.  Time:   Today, I have spent 13 minutes with the patient with telehealth technology discussing the above problems.     Medication Adjustments/Labs and Tests Ordered: Current medicines are reviewed at length with the patient today.  Concerns regarding medicines are outlined above.   Tests Ordered: No orders of the defined types were placed in this encounter.   Medication Changes: No orders of the defined types were placed in this encounter.   Follow Up:  Virtual Visit  in 6 week(s)  Signed, Carlyle Dolly, MD  09/28/2019 8:22 AM    [facility]

## 2019-10-10 ENCOUNTER — Telehealth: Payer: Self-pay | Admitting: *Deleted

## 2019-10-10 NOTE — Telephone Encounter (Signed)
-----   Message from Arnoldo Lenis, MD sent at 10/10/2019 12:46 PM EDT ----- Discussed in clinic   Zandra Abts MD

## 2019-10-17 ENCOUNTER — Ambulatory Visit (INDEPENDENT_AMBULATORY_CARE_PROVIDER_SITE_OTHER): Payer: Medicare Other

## 2019-10-17 DIAGNOSIS — I493 Ventricular premature depolarization: Secondary | ICD-10-CM

## 2019-10-17 DIAGNOSIS — I10 Essential (primary) hypertension: Secondary | ICD-10-CM | POA: Diagnosis not present

## 2019-10-17 LAB — ECHOCARDIOGRAM COMPLETE
AR max vel: 2.23 cm2
AV Area VTI: 2.43 cm2
AV Area mean vel: 2.33 cm2
AV Mean grad: 3.4 mmHg
AV Peak grad: 6.6 mmHg
Ao pk vel: 1.28 m/s
Area-P 1/2: 3.74 cm2
Calc EF: 76 %
MV M vel: 1.81 m/s
MV Peak grad: 13 mmHg
S' Lateral: 1.87 cm
Single Plane A2C EF: 75.7 %
Single Plane A4C EF: 76.6 %

## 2019-10-27 ENCOUNTER — Encounter: Payer: Self-pay | Admitting: *Deleted

## 2019-10-27 ENCOUNTER — Telehealth: Payer: Self-pay | Admitting: Cardiology

## 2019-10-27 ENCOUNTER — Encounter: Payer: Self-pay | Admitting: Cardiology

## 2019-10-27 ENCOUNTER — Telehealth: Payer: Self-pay | Admitting: *Deleted

## 2019-10-27 DIAGNOSIS — I493 Ventricular premature depolarization: Secondary | ICD-10-CM

## 2019-10-27 NOTE — Telephone Encounter (Signed)
Pt voiced understanding and agreeable to stress test - orders placed/instructions given for stress test - will forward to schedulers

## 2019-10-27 NOTE — Telephone Encounter (Signed)
Pre-cert Verification for the following procedure    LEXISCAN   DATE:  11/08/2019  LOCATION: [facility]

## 2019-10-27 NOTE — Telephone Encounter (Signed)
-----   Message from Arnoldo Lenis, MD sent at 10/27/2019  1:16 PM EDT ----- Echo overall looks good, normal heart function. Can we order a lexiscan for PVCs   Zandra Abts MD

## 2019-11-06 ENCOUNTER — Telehealth: Payer: 59 | Admitting: Cardiology

## 2019-11-08 ENCOUNTER — Encounter (HOSPITAL_COMMUNITY): Payer: Self-pay

## 2019-11-08 ENCOUNTER — Encounter (HOSPITAL_COMMUNITY)
Admission: RE | Admit: 2019-11-08 | Discharge: 2019-11-08 | Disposition: A | Discharge status: Home / Self Care | Payer: Medicare Other | Source: Ambulatory Visit | Attending: Cardiology | Admitting: Cardiology

## 2019-11-08 ENCOUNTER — Encounter (HOSPITAL_BASED_OUTPATIENT_CLINIC_OR_DEPARTMENT_OTHER)
Admission: RE | Admit: 2019-11-08 | Discharge: 2019-11-08 | Disposition: A | Discharge status: Home / Self Care | Payer: Medicare Other | Source: Ambulatory Visit | Attending: Cardiology | Admitting: Cardiology

## 2019-11-08 ENCOUNTER — Other Ambulatory Visit: Payer: Self-pay

## 2019-11-08 DIAGNOSIS — I493 Ventricular premature depolarization: Secondary | ICD-10-CM

## 2019-11-08 HISTORY — DX: Malignant (primary) neoplasm, unspecified: C80.1

## 2019-11-08 LAB — NM MYOCAR MULTI W/SPECT W/WALL MOTION / EF
LV dias vol: 41 mL (ref 46–106)
LV sys vol: 6 mL
Peak HR: 113 {beats}/min
RATE: 0.35
Rest HR: 72 {beats}/min
SDS: 3
SRS: 0
SSS: 3
TID: 0.95

## 2019-11-08 MED ORDER — REGADENOSON 0.4 MG/5ML IV SOLN
INTRAVENOUS | Status: AC
  Administered 2019-11-08: 0.4 mg via INTRAVENOUS
  Filled 2019-11-08: qty 5

## 2019-11-08 MED ORDER — TECHNETIUM TC 99M TETROFOSMIN IV KIT
10.0000 | PACK | Freq: Once | INTRAVENOUS | Status: AC | PRN
  Administered 2019-11-08: 10.4 via INTRAVENOUS

## 2019-11-08 MED ORDER — TECHNETIUM TC 99M TETROFOSMIN IV KIT
30.0000 | PACK | Freq: Once | INTRAVENOUS | Status: AC | PRN
  Administered 2019-11-08: 32 via INTRAVENOUS

## 2019-11-08 MED ORDER — SODIUM CHLORIDE FLUSH 0.9 % IV SOLN
INTRAVENOUS | Status: AC
  Administered 2019-11-08: 10 mL via INTRAVENOUS
  Filled 2019-11-08: qty 10

## 2019-11-14 ENCOUNTER — Telehealth: Payer: Self-pay | Admitting: Cardiology

## 2019-11-14 NOTE — Telephone Encounter (Signed)
Patient called requesting results of recent stress test.  

## 2019-11-14 NOTE — Telephone Encounter (Signed)
Patient is calling back regarding her stress test results. Please call back.

## 2019-11-14 NOTE — Telephone Encounter (Signed)
Left detailed message with results.  Ok per [employer]. Advised to call back if any questions.

## 2019-11-14 NOTE — Telephone Encounter (Signed)
Pt stated she did get info and apologized for calling back Pt is aware of findings and is"tickled everything looked good"/cy

## 2019-11-17 ENCOUNTER — Telehealth: Payer: Self-pay | Admitting: *Deleted

## 2019-11-17 NOTE — Telephone Encounter (Signed)
-----   Message from Arnoldo Lenis, MD sent at 11/10/2019  4:03 PM EDT ----- Stress test looks good, no signs of any blockages in the heart   J BrancH MD

## 2019-11-17 NOTE — Telephone Encounter (Signed)
Pt has been notified of echo results by phone with verbal understanding. Confirmed 11/28/19. Pt thanked me for the call. The patient has been notified of the result and verbalized understanding. All questions (if any) were answered. Julaine Hua, [facility] 11/16/2019 10:21 AM

## 2019-11-28 ENCOUNTER — Telehealth (INDEPENDENT_AMBULATORY_CARE_PROVIDER_SITE_OTHER): Payer: Medicare Other | Admitting: Cardiology

## 2019-11-28 ENCOUNTER — Encounter: Payer: Self-pay | Admitting: Cardiology

## 2019-11-28 VITALS — Ht 63.5 in | Wt 142.0 lb

## 2019-11-28 DIAGNOSIS — I493 Ventricular premature depolarization: Secondary | ICD-10-CM | POA: Diagnosis not present

## 2019-11-28 DIAGNOSIS — R002 Palpitations: Secondary | ICD-10-CM

## 2019-11-28 NOTE — Patient Instructions (Signed)

## 2019-11-28 NOTE — Progress Notes (Signed)
Virtual Visit via Telephone Note   This visit type was conducted due to national recommendations for restrictions regarding the COVID-19 Pandemic (e.g. social distancing) in an effort to limit this patient's exposure and mitigate transmission in our community.  Due to her co-morbid illnesses, this patient is at least at moderate risk for complications without adequate follow up.  This format is felt to be most appropriate for this patient at this time.  The patient did not have access to video technology/had technical difficulties with video requiring transitioning to audio format only (telephone).  All issues noted in this document were discussed and addressed.  No physical exam could be performed with this format.  Please refer to the patient's chart for her  consent to telehealth for [facility].    Date:  11/28/2019   ID:  Michele Villa, DOB 06/19/1945, MRN [id number] The patient was identified using 2 identifiers.  Patient Location: Home Provider Location: Office/Clinic  PCP:  Manon Hilding, MD  Cardiologist:  Carlyle Dolly, MD  Electrophysiologist:  None   Evaluation Performed:  Follow-Up Visit  Chief Complaint:  Follow up  History of Present Illness:    Michele Villa is a 74 y.o. female seen today for follow up of the following medical problems.    1. Palpitations/PVCs - started since she had covid - fluttering since October, 2020 - fluttering can last up to 1 minutes. Mild SOB when occurs. Notices more at rest.  - tea x 2-3 glasses, decaf coffee, no sodas, no energy drinks, rare EtoH.   - pcp EKG shows SR, isolated PVC  - 09/2019 event monitor: rare PACs, frequent ventricular ectopy (9%), short runs of NSVT up to 9 beats -symptoms less often since last visit  09/2019 echo LVEF 60-65%, no WMAs, grade I diastolic dysfunction, normal RV function.  10/2019 nuclear stress: no ischemia  - rare palpitations, about once a week and short in duration  The  patient does not have symptoms concerning for COVID-19 infection (fever, chills, cough, or new shortness of breath).    Past Medical History:  Diagnosis Date  . Arthritis    neck, shoulder and knees  . Cancer ([facility])    Skin  . GERD (gastroesophageal reflux disease)   . H. pylori infection 2012   treated by Dr Quintin Alto  . Hypercholesterolemia   . Hyperlipidemia   . IBS (irritable bowel syndrome)    Past Surgical History:  Procedure Laterality Date  . BIOPSY N/A 05/25/2012   Procedure: BIOPSY;  Surgeon: Daneil Dolin, MD;  [facility];  Service: Endoscopy;  Laterality: N/A;    . COLONOSCOPY  11/15/2002   RMR:Two anal papilla; otherwise, normal rectum, normal colon normal terminal ileum  . COLONOSCOPY N/A 05/25/2012   Dr. Rourk:Shallow left sided colonic diverticulosis. Minimally hemorrhoids. Status post segmental biopsy, benign  . ESOPHAGOGASTRODUODENOSCOPY (EGD) WITH ESOPHAGEAL DILATION N/A 05/25/2012   Dr. Raliegh Scarlet reflux esophagitis/Hiatal hernia, soft distal peptic stricture status post dilation. Small bowel biopsy benign.  . TUBAL LIGATION       No outpatient medications have been marked as taking for the 11/28/19 encounter (Appointment) with Arnoldo Lenis, MD.     Allergies:   Statins   Social History   Tobacco Use  . Smoking status: Former Smoker    Packs/day: 15.00    Types: Cigarettes    Quit date: 03/23/1980    Years since quitting: 39.7  . Smokeless tobacco: Never Used  Substance Use Topics  .  Alcohol use: No    Alcohol/week: 0.0 standard drinks    Comment: glass of wine with dinner twice a month  . Drug use: No     Family Hx: The patient's family history includes CAD in an other family member; Diabetes in her brother; GER disease in her father and mother; Lung cancer in her father. There is no history of Colon cancer.  ROS:   Please see the history of present illness.     All other systems reviewed and are negative.   Prior CV studies:     The following studies were reviewed today:  09/2019 echo  IMPRESSIONS    1. Left ventricular ejection fraction, by estimation, is 60 to 65%. The  left ventricle has normal function. The left ventricle has no regional  wall motion abnormalities. Left ventricular diastolic parameters are  consistent with Grade I diastolic  dysfunction (impaired relaxation).  2. Right ventricular systolic function is normal. The right ventricular  size is normal. There is normal pulmonary artery systolic pressure.  3. The mitral valve is normal in structure. No evidence of mitral valve  regurgitation. No evidence of mitral stenosis.  4. The aortic valve was not well visualized. Aortic valve regurgitation  is not visualized. No aortic stenosis is present.  5. The inferior vena cava is normal in size with greater than 50%  respiratory variability, suggesting right atrial pressure of 3 mmHg.   Labs/Other Tests and Data Reviewed:    EKG:  No ECG reviewed.  Recent Labs: No results found for requested labs within last 8760 hours.   Recent Lipid Panel No results found for: CHOL, TRIG, HDL, CHOLHDL, LDLCALC, LDLDIRECT  Wt Readings from Last 3 Encounters:  09/28/19 139 lb (63 kg)  08/29/19 138 lb 12.8 oz (63 kg)  07/26/14 139 lb 3.2 oz (63.1 kg)     Objective:    Vital Signs:   Today's Vitals   11/28/19 1006  Weight: 142 lb (64.4 kg)  Height: 5' 3.5" (1.613 m)   Body mass index is 24.76 kg/m.  Normal affect. Normal speech pattern and tone. Comfortable, no apparent distress. No audible signs of sob or wheezing.   ASSESSMENT & PLAN:    1. Palpitations/PVCsa - frequent PVCs on recent monitor with short runs of NSVT up to 9 beats - no evidence of undelrying structrual heart disease by echo and stress testing - symptoms mild and infrequent recently, monitor at this time. If progresses would try beta blocker   COVID-19 Education: The signs and symptoms of COVID-19 were discussed with the  patient and how to seek care for testing (follow up with PCP or arrange E-visit).  The importance of social distancing was discussed today.  Time:   Today, I have spent 13 minutes with the patient with telehealth technology discussing the above problems.     Medication Adjustments/Labs and Tests Ordered: Current medicines are reviewed at length with the patient today.  Concerns regarding medicines are outlined above.   Tests Ordered: No orders of the defined types were placed in this encounter.   Medication Changes: No orders of the defined types were placed in this encounter.   Follow Up:  In Person in 1 year(s)  Signed, Carlyle Dolly, MD  11/28/2019 8:02 AM    [place]

## 2020-03-21 DIAGNOSIS — K5792 Diverticulitis of intestine, part unspecified, without perforation or abscess without bleeding: Secondary | ICD-10-CM | POA: Diagnosis not present

## 2020-03-21 DIAGNOSIS — K5900 Constipation, unspecified: Secondary | ICD-10-CM | POA: Diagnosis not present

## 2020-03-21 DIAGNOSIS — Z6823 Body mass index (BMI) 23.0-23.9, adult: Secondary | ICD-10-CM | POA: Diagnosis not present

## 2021-01-10 ENCOUNTER — Encounter: Payer: Self-pay | Admitting: Cardiology

## 2021-01-10 ENCOUNTER — Ambulatory Visit: Payer: Medicare Other | Admitting: Cardiology

## 2021-01-10 ENCOUNTER — Other Ambulatory Visit: Payer: Self-pay

## 2021-01-10 VITALS — BP 120/70 | HR 68 | Ht 63.5 in | Wt 138.4 lb

## 2021-01-10 DIAGNOSIS — I493 Ventricular premature depolarization: Secondary | ICD-10-CM

## 2021-01-10 DIAGNOSIS — R002 Palpitations: Secondary | ICD-10-CM | POA: Diagnosis not present

## 2021-01-10 NOTE — Patient Instructions (Signed)

## 2021-01-10 NOTE — Progress Notes (Signed)
Clinical Summary Ms. Mcnall is a 75 y.o.female seen today for follow up of the following medical problems.      1. Palpitations/PVCs - started since she had covid - fluttering since October, 2020 - fluttering can last up to 1 minutes. Mild SOB when occurs. Notices more at rest.  - tea x 2-3 glasses, decaf coffee, no sodas, no energy drinks, rare EtoH.      - pcp EKG shows SR, isolated PVC   - 09/2019 event monitor: rare PACs, frequent ventricular ectopy (9%), short runs of NSVT up to 9 beats -symptoms less often since last visit   09/2019 echo LVEF 60-65%, no WMAs, grade I diastolic dysfunction, normal RV function.  10/2019 nuclear stress: no ischemia     - infrequent symptoms at times , overall tolerable   Past Medical History:  Diagnosis Date   Arthritis    neck, shoulder and knees   Cancer ([place])    Skin   GERD (gastroesophageal reflux disease)    H. pylori infection 2012   treated by Dr Quintin Alto   Hypercholesterolemia    Hyperlipidemia    IBS (irritable bowel syndrome)      Allergies  Allergen Reactions   Statins Hives and Itching     Current Outpatient Medications  Medication Sig Dispense Refill   Calcium Carbonate (CALCIUM 600 PO) Take by mouth.     DEXILANT 60 MG capsule TAKE ONE CAPSULE BY MOUTH EVERY DAY (Patient taking differently: EVERY OTHER DAY) 90 capsule 3   dicyclomine (BENTYL) 10 MG capsule Take 10 mg by mouth 4 (four) times daily.     ezetimibe (ZETIA) 10 MG tablet Take 10 mg by mouth daily.     loperamide (IMODIUM A-D) 2 MG tablet Take 2 mg by mouth 4 (four) times daily as needed for diarrhea or loose stools.     Multiple Vitamins-Minerals (CENTRUM SILVER ULTRA WOMENS PO) Take by mouth.     Multiple Vitamins-Minerals (PRESERVISION AREDS PO) Take 1 capsule by mouth in the morning and at bedtime.     Omega-3 Fatty Acids (FISH OIL) 1200 MG CAPS Take 1,000 mg by mouth daily.      No current facility-administered medications for this visit.      Past Surgical History:  Procedure Laterality Date   BIOPSY N/A 05/25/2012   Procedure: BIOPSY;  Surgeon: Daneil Dolin, MD;  [facility];  Service: Endoscopy;  Laterality: N/A;     COLONOSCOPY  11/15/2002   RMR:Two anal papilla; otherwise, normal rectum, normal colon normal terminal ileum   COLONOSCOPY N/A 05/25/2012   Dr. Rourk:Shallow left sided colonic diverticulosis. Minimally hemorrhoids. Status post segmental biopsy, benign   ESOPHAGOGASTRODUODENOSCOPY (EGD) WITH ESOPHAGEAL DILATION N/A 05/25/2012   Dr. Raliegh Scarlet reflux esophagitis/Hiatal hernia, soft distal peptic stricture status post dilation. Small bowel biopsy benign.   TUBAL LIGATION       Allergies  Allergen Reactions   Statins Hives and Itching      Family History  Problem Relation Age of Onset   GER disease Mother    Diabetes Brother    CAD Other    Lung cancer Father    GER disease Father    Colon cancer Neg Hx      Social History Ms. Slappey reports that she quit smoking about 40 years ago. Her smoking use included cigarettes. She smoked an average of 15 packs per day. She has never used smokeless tobacco. Ms. Klimas reports no history of alcohol use.  Review of Systems CONSTITUTIONAL: No weight loss, fever, chills, weakness or fatigue.  HEENT: Eyes: No visual loss, blurred vision, double vision or yellow sclerae.No hearing loss, sneezing, congestion, runny nose or sore throat.  SKIN: No rash or itching.  CARDIOVASCULAR: per hpi RESPIRATORY: No shortness of breath, cough or sputum.  GASTROINTESTINAL: No anorexia, nausea, vomiting or diarrhea. No abdominal pain or blood.  GENITOURINARY: No burning on urination, no polyuria NEUROLOGICAL: No headache, dizziness, syncope, paralysis, ataxia, numbness or tingling in the extremities. No change in bowel or bladder control.  MUSCULOSKELETAL: No muscle, back pain, joint pain or stiffness.  LYMPHATICS: No enlarged nodes. No history of  splenectomy.  PSYCHIATRIC: No history of depression or anxiety.  ENDOCRINOLOGIC: No reports of sweating, cold or heat intolerance. No polyuria or polydipsia.  Marland Kitchen   Physical Examination Today's Vitals   01/10/21 1002  BP: 120/70  Pulse: 68  SpO2: 94%  Weight: 138 lb 6.4 oz (62.8 kg)  Height: 5' 3.5" (1.613 m)   Body mass index is 24.13 kg/m.  Gen: resting comfortably, no acute distress HEENT: no scleral icterus, pupils equal round and reactive, no palptable cervical adenopathy,  CV: RRR, no m/r,g no jvd Resp: Clear to auscultation bilaterally GI: abdomen is soft, non-tender, non-distended, normal bowel sounds, no hepatosplenomegaly MSK: extremities are warm, no edema.  Skin: warm, no rash Neuro:  no focal deficits Psych: appropriate affect   Diagnostic Studies 09/2019 echo  IMPRESSIONS     1. Left ventricular ejection fraction, by estimation, is 60 to 65%. The  left ventricle has normal function. The left ventricle has no regional  wall motion abnormalities. Left ventricular diastolic parameters are  consistent with Grade I diastolic  dysfunction (impaired relaxation).   2. Right ventricular systolic function is normal. The right ventricular  size is normal. There is normal pulmonary artery systolic pressure.   3. The mitral valve is normal in structure. No evidence of mitral valve  regurgitation. No evidence of mitral stenosis.   4. The aortic valve was not well visualized. Aortic valve regurgitation  is not visualized. No aortic stenosis is present.   5. The inferior vena cava is normal in size with greater than 50%  respiratory variability, suggesting right atrial pressure of 3 mmHg.     Assessment and Plan  1. Palpitations/PVCs - frequent PVCs on prior  monitor with short runs of NSVT up to 9 beats - no evidence of undelrying structrual heart disease by echo and stress testing - infrequent symptoms, conitnue to monitor. If progression could consider beta  blocker.   - EKG today shows NSR  F/u 1 year   Arnoldo Lenis, M.D.

## 2021-01-13 ENCOUNTER — Encounter: Payer: Self-pay | Admitting: *Deleted

## 2021-06-09 NOTE — Progress Notes (Signed)
? ? ?Primary Care Physician:  Manon Hilding, MD ?Primary Gastroenterologist:  Dr.  Marland Kitchen ?Pre-Procedure History & Physical: ?HPI:  Michele Villa is a 76 y.o. female here for follow-up of diarrhea.  Patient has a 40+ year history of intermittent postprandial diarrhea with episodes of fecal incontinence from time to time.  No rectal bleeding.  No abdominal pain.  Has to wear a diaper.  Does have regular bowel movements on occasion.  When she is going out she takes Imodium and this helps tremendously.  Did not feel Bentyl helped previously.  Prior colonoscopy almost 10 years ago demonstrated diverticulosis; biopsies negative for microscopic colitis.  Normal TI. ?Celiac screen previously negative.  No history of IBD in the family. ?She only takes Imodium on a as needed basis and does not take it more than once a day.  No bleeding. ? ?GERD well-controlled on Dexilant 60 mg qod.  No dysphagia. ? ?Past Medical History:  ?Diagnosis Date  ? Arthritis   ? neck, shoulder and knees  ? Cancer [facility])   ? Skin  ? GERD (gastroesophageal reflux disease)   ? H. pylori infection 2012  ? treated by Dr Quintin Alto  ? Hypercholesterolemia   ? Hyperlipidemia   ? IBS (irritable bowel syndrome)   ? ? ?Past Surgical History:  ?Procedure Laterality Date  ? BIOPSY N/A 05/25/2012  ? Procedure: BIOPSY;  Surgeon: Daneil Dolin, MD;  [facility];  Service: Endoscopy;  Laterality: N/A;    ? COLONOSCOPY  11/15/2002  ? RMR:Two anal papilla; otherwise, normal rectum, normal colon normal terminal ileum  ? COLONOSCOPY N/A 05/25/2012  ? Dr. Christinia Lambeth:Shallow left sided colonic diverticulosis. Minimally hemorrhoids. Status post segmental biopsy, benign  ? ESOPHAGOGASTRODUODENOSCOPY (EGD) WITH ESOPHAGEAL DILATION N/A 05/25/2012  ? Dr. Raliegh Scarlet reflux esophagitis/Hiatal hernia, soft distal peptic stricture status post dilation. Small bowel biopsy benign.  ? TUBAL LIGATION    ? ? ?Prior to Admission medications   ?Medication Sig Start Date End Date Taking?  Authorizing Provider  ?amoxicillin-clavulanate (AUGMENTIN) 875-125 MG tablet Take 1 tablet by mouth 2 (two) times daily. 01/07/21   [provider]  ?Calcium Carbonate (CALCIUM 600 PO) Take by mouth.    [provider]  ?DEXILANT 60 MG capsule TAKE ONE CAPSULE BY MOUTH EVERY DAY ?Patient taking differently: EVERY OTHER DAY 04/25/18   Annitta Needs, NP  ?dicyclomine (BENTYL) 10 MG capsule Take 10 mg by mouth 4 (four) times daily. ?Patient not taking: Reported on 01/10/2021 08/14/19   [provider]  ?ezetimibe (ZETIA) 10 MG tablet Take 10 mg by mouth daily.    [provider]  ?loperamide (IMODIUM A-D) 2 MG tablet Take 2 mg by mouth 4 (four) times daily as needed for diarrhea or loose stools.    [provider]  ?Multiple Vitamins-Minerals (CENTRUM SILVER ULTRA WOMENS PO) Take by mouth.    [provider]  ?Multiple Vitamins-Minerals (PRESERVISION AREDS PO) Take 1 capsule by mouth in the morning and at bedtime.    [provider]  ?Omega-3 Fatty Acids (FISH OIL) 1200 MG CAPS Take 1,000 mg by mouth daily.  ?Patient not taking: Reported on 01/10/2021    [provider]  ? ? ?Allergies as of 06/10/2021 - Review Complete 01/10/2021  ?Allergen Reaction Noted  ? Statins Hives and Itching 05/12/2012  ? ? ?Family History  ?Problem Relation Age of Onset  ? GER disease Mother   ? Diabetes Brother   ? CAD Other   ?  Lung cancer Father   ? GER disease Father   ? Colon cancer Neg Hx   ? ? ?Social History  ? ?Socioeconomic History  ? Marital status: Married  ?  Spouse name: Not on file  ? Number of children: 1  ? Years of education: Not on file  ? Highest education level: Not on file  ?Occupational History  ? Occupation: retired, [employer]gallery conservator  ?Tobacco Use  ? Smoking status: Former  ?  Packs/day: 15.00  ?  Types: Cigarettes  ?  Quit date: 03/23/1980  ?  Years since quitting: 41.2  ? Smokeless tobacco: Never  ?Substance and Sexual Activity  ? Alcohol  use: No  ?  Alcohol/week: 0.0 standard drinks  ?  Comment: glass of wine with dinner twice a month  ? Drug use: No  ? Sexual activity: Yes  ?Other Topics Concern  ? Not on file  ?Social History Narrative  ? Lives w/ husband  ? ?Social Determinants of Health  ? ?Financial Resource Strain: Not on file  ?Food Insecurity: Not on file  ?Transportation Needs: Not on file  ?Physical Activity: Not on file  ?Stress: Not on file  ?Social Connections: Not on file  ?Intimate Partner Violence: Not on file  ? ? ?Review of Systems: ?See HPI, otherwise negative ROS ? ?Physical Exam: ?There were no vitals taken for this visit. ?General:   Alert,  Well-developed, well-nourished, pleasant and cooperative in NAD ?Neck:  Supple; no masses or thyromegaly. No significant cervical adenopathy. ?Lungs:  Clear throughout to auscultation.   No wheezes, crackles, or rhonchi. No acute distress. ?Heart:  Regular rate and rhythm; no murmurs, clicks, rubs,  or gallops. ?Abdomen: Non-distended, normal bowel sounds.  Soft and nontender without appreciable mass or hepatosplenomegaly.  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  She has had symptoms for decades.  She has a durable diagnosis. ? ?Discussed the chronic nature of IBS along with the noncurable nature of this entity with the patient at length today. ? ?Multiple therapeutic options.  However, I do not think we have maximized Imodium's potential. ? ?GERD well-controlled on Dexilant 60 mg daily.  Reviewed side effects.  At this point, the benefits outweigh the risks (particularly in the way that she is taking the medication qod with good control) ? ?Recommendations: ? ?Information on diarrhea predominant irritable bowel syndrome provided ? ?As discussed, lets maximize Imodium as potential before we try any other treatment options ? ?Specifically, take Imodium 2 mg at bedtime and then again in the morning do this every day whether you feel you needed  or not.  Only reason to hold the medication is if you get significant constipation ? ?Keep a stool diary. ? ?Continue Dexilant 60 mg daily.  As discussed the benefits outweigh the risks-reviewed. ? ?Office visit in 3 months ? ?Call in in 1 month and let us know how you are doing ? ?As discussed, 1 more colonoscopy recommended but it is not urgent at this time ? ?Further recommendations to follow. ? ? ? ? ?Notice: This dictation was prepared with Dragon dictation along with smaller phrase technology. Any transcriptional errors that result from this process are unintentional and may not be corrected upon review.  ?

## 2021-06-10 ENCOUNTER — Ambulatory Visit: Payer: Medicare Other | Admitting: Internal Medicine

## 2021-06-10 ENCOUNTER — Encounter: Payer: Self-pay | Admitting: Internal Medicine

## 2021-06-10 ENCOUNTER — Other Ambulatory Visit: Payer: Self-pay

## 2021-06-10 VITALS — BP 138/64 | HR 78 | Temp 97.3°F | Ht 63.5 in | Wt 136.6 lb

## 2021-06-10 DIAGNOSIS — K219 Gastro-esophageal reflux disease without esophagitis: Secondary | ICD-10-CM | POA: Diagnosis not present

## 2021-06-10 DIAGNOSIS — K580 Irritable bowel syndrome with diarrhea: Secondary | ICD-10-CM

## 2021-06-10 NOTE — Patient Instructions (Signed)
It was good to see you again today! ? ?Information on diarrhea predominant irritable bowel syndrome provided ? ?As discussed, lets maximize Imodium as potential before we try any other treatment options ? ?Specifically, take Imodium 2 mg at bedtime and then again in the morning do this every day whether you feel you needed or not.  Only reason to hold the medication is if you get significant constipation ? ?Keep a stool diary. ? ?Continue Dexilant 60 mg daily.  As discussed the benefits outweigh the risks-reviewed. ? ?Office visit in 3 months ? ?Call in in 1 month and let us know how you are doing ? ?As discussed, 1 more colonoscopy recommended but it is not urgent at this time ? ?Further recommendations to follow. ?

## 2021-09-18 ENCOUNTER — Ambulatory Visit: Payer: Medicare Other | Admitting: Gastroenterology

## 2021-10-07 ENCOUNTER — Ambulatory Visit: Payer: Medicare Other | Admitting: Internal Medicine

## 2022-01-29 ENCOUNTER — Encounter: Payer: Self-pay | Admitting: Internal Medicine

## 2023-06-02 ENCOUNTER — Encounter: Payer: Self-pay | Admitting: *Deleted

## 2023-06-22 ENCOUNTER — Telehealth: Payer: Self-pay | Admitting: Gastroenterology

## 2023-06-22 ENCOUNTER — Ambulatory Visit (INDEPENDENT_AMBULATORY_CARE_PROVIDER_SITE_OTHER): Admitting: Gastroenterology

## 2023-06-22 ENCOUNTER — Encounter: Payer: Self-pay | Admitting: Gastroenterology

## 2023-06-22 VITALS — BP 126/72 | HR 65 | Temp 97.9°F | Ht 63.5 in | Wt 136.2 lb

## 2023-06-22 DIAGNOSIS — Z1211 Encounter for screening for malignant neoplasm of colon: Secondary | ICD-10-CM | POA: Insufficient documentation

## 2023-06-22 DIAGNOSIS — K869 Disease of pancreas, unspecified: Secondary | ICD-10-CM | POA: Insufficient documentation

## 2023-06-22 DIAGNOSIS — R1310 Dysphagia, unspecified: Secondary | ICD-10-CM | POA: Diagnosis not present

## 2023-06-22 DIAGNOSIS — K589 Irritable bowel syndrome without diarrhea: Secondary | ICD-10-CM | POA: Diagnosis not present

## 2023-06-22 DIAGNOSIS — K219 Gastro-esophageal reflux disease without esophagitis: Secondary | ICD-10-CM | POA: Diagnosis not present

## 2023-06-22 MED ORDER — RIFAXIMIN 550 MG PO TABS: 550.0000 mg | ORAL_TABLET | Freq: Three times a day (TID) | ORAL | 0 refills | Status: AC

## 2023-06-22 NOTE — H&P (View-Only) (Signed)
 Gastroenterology Office Note     Primary Care Physician:  Estanislado Pandy, MD  Primary Gastroenterologist: Dr. Jena Gauss    Chief Complaint   Chief Complaint  Patient presents with   Irritable Bowel Syndrome    States that she goes to the bathroom 5 to 6 times a day   Dysphagia    Also states that food will get hung up if she is in a hurry   Gastroesophageal Reflux    Constantly has to clear her throat     History of Present Illness   Michele Villa is a 78 y.o. 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   IBS: no improvement with dicyclomine in the past. Imodium prn. Kept stool diary. Took Imodium morning and night when last seen and had severe constipation. Will have some formed BMs during the day along with looser stool. As a child was very constipated. Feels bowel movements are incomplete and has frequent stools back to back with urgency. Some formed, majority urgent, watery.  In the past has felt like less control while on Metamucil. Limited control if not able to get to the bathroom at right time. Left sided cramping at times with some improvement after BM.   Pancreatic leasion: CT Feb 2024 with 19 by 12 mm pancreatic head cystic lesion. 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 Recommending MRI in 1 year.   GERD: coughing and gagging consistently. Clearing throat. Careful with eating. Can't eat quickly as will have choking. Has to drink with food. Behavior modification has helped. She would like an endoscopy. Taking PPI every other day.     Colonoscopy 2014: benign segmental biopsies, shallow left-sided colonic diverticulosis with benign segmental biopsies   EGD 2014: ERE,  soft distal peptic stricture s/p dilation, small bowel biopsy benign.    Past Medical History:  Diagnosis Date   Arthritis    neck, shoulder and knees   Cancer (HCC)    Skin   GERD (gastroesophageal reflux disease)    H. pylori infection 2012   treated by Dr Neita Carp   Hypercholesterolemia    Hyperlipidemia    IBS (irritable bowel syndrome)     Past Surgical History:  Procedure Laterality Date   BIOPSY N/A 05/25/2012   Procedure: BIOPSY;  Surgeon: Corbin Ade, MD;  Location[facility];  Service: Endoscopy;  Laterality: N/A;     COLONOSCOPY  11/15/2002   RMR:Two anal papilla; otherwise, normal rectum, normal colon normal terminal ileum   COLONOSCOPY N/A 05/25/2012   Dr. Rourk:Shallow left sided colonic diverticulosis. Minimally hemorrhoids. Status post segmental biopsy, benign   ESOPHAGOGASTRODUODENOSCOPY (EGD) WITH ESOPHAGEAL DILATION N/A 05/25/2012   Dr. Darnelle Going reflux esophagitis/Hiatal hernia, soft distal peptic stricture status post dilation. Small bowel biopsy benign.   TUBAL LIGATION      Current Outpatient Medications  Medication Sig Dispense Refill   DEXILANT 60 MG capsule TAKE ONE CAPSULE BY MOUTH EVERY DAY (Patient taking differently: Take 60 mg by mouth every other day. EVERY OTHER DAY) 90 capsule 3   ezetimibe (ZETIA) 10 MG tablet Take 10 mg by mouth daily.     Garlic 1000 MG CAPS Take 1 capsule by mouth daily.  loratadine (CLARITIN) 10 MG tablet Take 10 mg by mouth daily.     Multiple Vitamins-Minerals (PRESERVISION AREDS PO) Take 1 capsule by mouth in the morning and at bedtime.     No current facility-administered medications for this visit.    Allergies as of 06/22/2023 - Review Complete 06/22/2023  Allergen Reaction Noted   Statins Hives and Itching 05/12/2012    Family History  Problem Relation Age of Onset   GER disease Mother    Diabetes Brother    CAD Other    Lung cancer Father    GER disease Father    Colon cancer Neg Hx      Social History   Socioeconomic History   Marital status: Married    Spouse name: Not on file   Number of children: 1   Years of education: Not on file   Highest education level: Not on file  Occupational History   Occupation: retired, [employer] Nutritional therapist  Tobacco Use   Smoking status: Former    Current packs/day: 0.00    Types: Cigarettes    Quit date: 03/23/1980    Years since quitting: 43.2   Smokeless tobacco: Never  Substance and Sexual Activity   Alcohol use: No    Alcohol/week: 0.0 standard drinks of alcohol    Comment: glass of wine with dinner twice a month   Drug use: No   Sexual activity: Yes  Other Topics Concern   Not on file  Social History Narrative   Lives w/ husband   Social Drivers of Health   Financial Resource Strain: Not on file  Food Insecurity: Not on file  Transportation Needs: Not on file  Physical Activity: Sufficiently Active (09/07/2018)   Received [facility]   Exercise Vital Sign    Days of Exercise per Week: 7 days    Minutes of Exercise per Session: 40 min  Stress: Not on file  Social Connections: Not on file  Intimate Partner Violence: Not At Risk (09/07/2018)   Received from [facility]   Humiliation, Afraid, Rape, and Kick questionnaire    Fear of Current or Ex-Partner: No    Emotionally Abused: No    Physically Abused: No    Sexually Abused: No     Review of Systems   Gen: Denies any fever, chills, fatigue, weight loss, lack of appetite.  CV: Denies chest pain, heart palpitations, peripheral edema, syncope.  Resp: Denies shortness of breath at rest or with exertion. Denies wheezing or cough.  GI: Denies dysphagia or odynophagia. Denies jaundice, hematemesis, fecal incontinence. GU : Denies urinary burning, urinary frequency, urinary hesitancy MS: Denies joint pain, muscle weakness, cramps, or limitation of movement.  Derm: Denies rash, itching, dry skin Psych: Denies  depression, anxiety, memory loss, and confusion Heme: Denies bruising, bleeding, and enlarged lymph nodes.   Physical Exam   BP 126/72 (BP Location: Right Arm, Patient Position: Sitting, Cuff Size: Normal)   Pulse 65   Temp 97.9 F (36.6 C) (Oral)   Ht 5' 3.5" (1.613 m)   Wt 136 lb 3.2 oz (61.8 kg)   SpO2 97%   BMI 23.75 kg/m  General:   Alert and oriented. Pleasant and cooperative. Well-nourished and well-developed.  Head:  Normocephalic and atraumatic. Eyes:  Without icterus Abdomen:  +BS, soft, non-tender and non-distended. No HSM noted. No guarding or rebound. No masses appreciated.  Rectal:  Deferred  Msk:  Symmetrical without gross deformities. Normal posture.  Extremities:  Without edema. Neurologic:  Alert and  oriented x4;  grossly normal neurologically. Skin:  Intact without significant lesions or rashes. Psych:  Alert and cooperative. Normal mood and affect.   Assessment   Michele Villa is a 78 y.o. 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   IBS: in past has felt diarrhea predominant. Upon looking at her stool diary, I am unable to exclude maybe a component of overflow urgency/incontinence. Imodium tends to overshoot the mark. Prior colonoscopy with negative segmental biopsies, negative duodenal biopsies. No alarm signs/symptoms. Will trial Xifaxan. Continue stool diary.   GERD: taking Dexilant every other day. Has intermittent clearing of throat, coughing, likely due to LPR. She notes some esophageal dysphagia if not chewing well or slowly. Prior dilation of peptic stricture in 2014. Requesting EGD/dilation, which we can certainly arrange. Would benefit from daily PPI.  Screening colonoscopy: last in 2014. She is requesting updated colonoscopy. Although 70, she is very healthy and  will arrange this.   IPMN: noted on CT last year, with recent MRI on file. Repeat MRI in 1 year.     PLAN    Recommend Dexilant daily Course of Xifaxan TID for 14 days Proceed with colonoscopy/EGD/dilation by Dr. Jena Gauss in near future: the risks, benefits, and alternatives have been discussed with the patient in detail. The patient states understanding and desires to proceed. Continue with stool diary Return in 3 months  MRI in Feb 2026   Gelene Mink, PhD, ANP-BC [facility]

## 2023-06-22 NOTE — Progress Notes (Signed)
 Gastroenterology Office Note     Primary Care Physician:  Estanislado Pandy, MD  Primary Gastroenterologist: Dr. Jena Gauss    Chief Complaint   Chief Complaint  Patient presents with   Irritable Bowel Syndrome    States that she goes to the bathroom 5 to 6 times a day   Dysphagia    Also states that food will get hung up if she is in a hurry   Gastroesophageal Reflux    Constantly has to clear her throat     History of Present Illness   Michele Villa is a 78 y.o. 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   IBS: no improvement with dicyclomine in the past. Imodium prn. Kept stool diary. Took Imodium morning and night when last seen and had severe constipation. Will have some formed BMs during the day along with looser stool. As a child was very constipated. Feels bowel movements are incomplete and has frequent stools back to back with urgency. Some formed, majority urgent, watery.  In the past has felt like less control while on Metamucil. Limited control if not able to get to the bathroom at right time. Left sided cramping at times with some improvement after BM.   Pancreatic leasion: CT Feb 2024 with 19 by 12 mm pancreatic head cystic lesion. 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 Recommending MRI in 1 year.   GERD: coughing and gagging consistently. Clearing throat. Careful with eating. Can't eat quickly as will have choking. Has to drink with food. Behavior modification has helped. She would like an endoscopy. Taking PPI every other day.     Colonoscopy 2014: benign segmental biopsies, shallow left-sided colonic diverticulosis with benign segmental biopsies   EGD 2014: ERE,  soft distal peptic stricture s/p dilation, small bowel biopsy benign.    Past Medical History:  Diagnosis Date   Arthritis    neck, shoulder and knees   Cancer (HCC)    Skin   GERD (gastroesophageal reflux disease)    H. pylori infection 2012   treated by Dr Neita Carp   Hypercholesterolemia    Hyperlipidemia    IBS (irritable bowel syndrome)     Past Surgical History:  Procedure Laterality Date   BIOPSY N/A 05/25/2012   Procedure: BIOPSY;  Surgeon: Corbin Ade, MD;  [facility];  Service: Endoscopy;  Laterality: N/A;     COLONOSCOPY  11/15/2002   RMR:Two anal papilla; otherwise, normal rectum, normal colon normal terminal ileum   COLONOSCOPY N/A 05/25/2012   Dr. Rourk:Shallow left sided colonic diverticulosis. Minimally hemorrhoids. Status post segmental biopsy, benign   ESOPHAGOGASTRODUODENOSCOPY (EGD) WITH ESOPHAGEAL DILATION N/A 05/25/2012   Dr. Darnelle Going reflux esophagitis/Hiatal hernia, soft distal peptic stricture status post dilation. Small bowel biopsy benign.   TUBAL LIGATION      Current Outpatient Medications  Medication Sig Dispense Refill   DEXILANT 60 MG capsule TAKE ONE CAPSULE BY MOUTH EVERY DAY (Patient taking differently: Take 60 mg by mouth every other day. EVERY OTHER DAY) 90 capsule 3   ezetimibe (ZETIA) 10 MG tablet Take 10 mg by mouth daily.     Garlic 1000 MG CAPS Take 1 capsule by mouth daily.  loratadine (CLARITIN) 10 MG tablet Take 10 mg by mouth daily.     Multiple Vitamins-Minerals (PRESERVISION AREDS PO) Take 1 capsule by mouth in the morning and at bedtime.     No current facility-administered medications for this visit.    Allergies as of 06/22/2023 - Review Complete 06/22/2023  Allergen Reaction Noted   Statins Hives and Itching 05/12/2012    Family History  Problem Relation Age of Onset   GER disease Mother    Diabetes Brother    CAD Other    Lung cancer Father    GER disease Father    Colon cancer Neg Hx      Social History   Socioeconomic History   Marital status: Married    Spouse name: Not on file   Number of children: 1   Years of education: Not on file   Highest education level: Not on file  Occupational History   Occupation: retired, [employer] Nutritional therapist  Tobacco Use   Smoking status: Former    Current packs/day: 0.00    Types: Cigarettes    Quit date: 03/23/1980    Years since quitting: 43.2   Smokeless tobacco: Never  Substance and Sexual Activity   Alcohol use: No    Alcohol/week: 0.0 standard drinks of alcohol    Comment: glass of wine with dinner twice a month   Drug use: No   Sexual activity: Yes  Other Topics Concern   Not on file  Social History Narrative   Lives w/ husband   Social Drivers of Health   Financial Resource Strain: Not on file  Food Insecurity: Not on file  Transportation Needs: Not on file  Physical Activity: Sufficiently Active (09/07/2018)   Received from [facility]   Exercise Vital Sign    Days of Exercise per Week: 7 days    Minutes of Exercise per Session: 40 min  Stress: Not on file  Social Connections: Not on file  Intimate Partner Violence: Not At Risk (09/07/2018)   Received from [facility]   Humiliation, Afraid, Rape, and Kick questionnaire    Fear of Current or Ex-Partner: No    Emotionally Abused: No    Physically Abused: No    Sexually Abused: No     Review of Systems   Gen: Denies any fever, chills, fatigue, weight loss, lack of appetite.  CV: Denies chest pain, heart palpitations, peripheral edema, syncope.  Resp: Denies shortness of breath at rest or with exertion. Denies wheezing or cough.  GI: Denies dysphagia or odynophagia. Denies jaundice, hematemesis, fecal incontinence. GU : Denies urinary burning, urinary frequency, urinary hesitancy MS: Denies joint pain, muscle weakness, cramps, or limitation of movement.  Derm: Denies rash, itching, dry skin Psych: Denies  depression, anxiety, memory loss, and confusion Heme: Denies bruising, bleeding, and enlarged lymph nodes.   Physical Exam   BP 126/72 (BP Location: Right Arm, Patient Position: Sitting, Cuff Size: Normal)   Pulse 65   Temp 97.9 F (36.6 C) (Oral)   Ht 5' 3.5" (1.613 m)   Wt 136 lb 3.2 oz (61.8 kg)   SpO2 97%   BMI 23.75 kg/m  General:   Alert and oriented. Pleasant and cooperative. Well-nourished and well-developed.  Head:  Normocephalic and atraumatic. Eyes:  Without icterus Abdomen:  +BS, soft, non-tender and non-distended. No HSM noted. No guarding or rebound. No masses appreciated.  Rectal:  Deferred  Msk:  Symmetrical without gross deformities. Normal posture.  Extremities:  Without edema. Neurologic:  Alert and  oriented x4;  grossly normal neurologically. Skin:  Intact without significant lesions or rashes. Psych:  Alert and cooperative. Normal mood and affect.   Assessment   Michele Villa is a 78 y.o. 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   IBS: in past has felt diarrhea predominant. Upon looking at her stool diary, I am unable to exclude maybe a component of overflow urgency/incontinence. Imodium tends to overshoot the mark. Prior colonoscopy with negative segmental biopsies, negative duodenal biopsies. No alarm signs/symptoms. Will trial Xifaxan. Continue stool diary.   GERD: taking Dexilant every other day. Has intermittent clearing of throat, coughing, likely due to LPR. She notes some esophageal dysphagia if not chewing well or slowly. Prior dilation of peptic stricture in 2014. Requesting EGD/dilation, which we can certainly arrange. Would benefit from daily PPI.  Screening colonoscopy: last in 2014. She is requesting updated colonoscopy. Although 70, she is very healthy and  will arrange this.   IPMN: noted on CT last year, with recent MRI on file. Repeat MRI in 1 year.     PLAN    Recommend Dexilant daily Course of Xifaxan TID for 14 days Proceed with colonoscopy/EGD/dilation by Dr. Jena Gauss in near future: the risks, benefits, and alternatives have been discussed with the patient in detail. The patient states understanding and desires to proceed. Continue with stool diary Return in 3 months  MRI in Feb 2026   Gelene Mink, PhD, ANP-BC [facility]

## 2023-06-22 NOTE — Patient Instructions (Signed)
 We are arranging an upper endoscopy with dilation and colonoscopy in the near future.  I have sent in Xifaxan for you to take 3 times a day for 14 days. We might need to have a prior authorization.  Please continue to keep the stool diary.   We will see you back in 3 months!  I enjoyed seeing you again today! I value our relationship and want to provide genuine, compassionate, and quality care. You may receive a survey regarding your visit with me, and I welcome your feedback! Thanks so much for taking the time to complete this. I look forward to seeing you again.      Gelene Mink, PhD, ANP-BC [facility]

## 2023-06-22 NOTE — Telephone Encounter (Signed)
 Pt was made aware and verbalized understanding.

## 2023-06-22 NOTE — Telephone Encounter (Signed)
 Can we call patient and recommend she take Dexilant daily for now due to GERD symptoms? Upcoming EGD/dilation in future. Would benefit from this to help with control.

## 2023-06-30 ENCOUNTER — Telehealth: Payer: Self-pay | Admitting: *Deleted

## 2023-06-30 MED ORDER — SUFLAVE 178.7 G PO SOLR: 1.0000 | ORAL | 0 refills | Status: DC

## 2023-06-30 NOTE — Telephone Encounter (Signed)
 Called pt and she has been scheduled for TCS/EGD/ED with propofol asa 2 with Dr. Jena Gauss for 5/1. Aware will send rx for prep to pharmacy. Instructions to be mailed.

## 2023-07-21 NOTE — Anesthesia Preprocedure Evaluation (Addendum)
 Anesthesia Evaluation  Patient identified by MRN, date of birth, ID band Patient awake    Reviewed: reviewed documented beta blocker date and time   Airway Mallampati: II  TM Distance: >3 FB Neck ROM: Full    Dental no notable dental hx. (+) Dental Advisory Given, Teeth Intact   Pulmonary neg pulmonary ROS, former smoker   Pulmonary exam normal breath sounds clear to auscultation       Cardiovascular Normal cardiovascular exam Rhythm:Regular Rate:Normal     Neuro/Psych negative neurological ROS  negative psych ROS   GI/Hepatic Neg liver ROS,GERD  ,,IBS   Endo/Other  negative endocrine ROS    Renal/GU negative Renal ROS     Musculoskeletal  (+) Arthritis , Osteoarthritis,    Abdominal   Peds  Hematology negative hematology ROS (+)   Anesthesia Other Findings cancer  Reproductive/Obstetrics                             Anesthesia Physical Anesthesia Plan  ASA: 2  Anesthesia Plan: General   Post-op Pain Management: Minimal or no pain anticipated   Induction: Intravenous  PONV Risk Score and Plan: Propofol  infusion  Airway Management Planned: Natural Airway and Nasal Cannula  Additional Equipment: None  Intra-op Plan:   Post-operative Plan:   Informed Consent: I have reviewed the patients History and Physical, chart, labs and discussed the procedure including the risks, benefits and alternatives for the proposed anesthesia with the patient or authorized representative who has indicated his/her understanding and acceptance.     Dental advisory given  Plan Discussed with: CRNA  Anesthesia Plan Comments:        Anesthesia Quick Evaluation

## 2023-07-22 ENCOUNTER — Ambulatory Visit (HOSPITAL_COMMUNITY)
Admission: RE | Admit: 2023-07-22 | Discharge: 2023-07-22 | Disposition: A | Discharge status: Home / Self Care | Attending: Internal Medicine | Admitting: Internal Medicine

## 2023-07-22 ENCOUNTER — Encounter (HOSPITAL_COMMUNITY)
Admission: RE | Disposition: A | Discharge status: Home / Self Care | Payer: Self-pay | Source: Home / Self Care | Attending: Internal Medicine

## 2023-07-22 ENCOUNTER — Ambulatory Visit (HOSPITAL_BASED_OUTPATIENT_CLINIC_OR_DEPARTMENT_OTHER): Admitting: Anesthesiology

## 2023-07-22 ENCOUNTER — Other Ambulatory Visit: Payer: Self-pay

## 2023-07-22 ENCOUNTER — Encounter (HOSPITAL_COMMUNITY): Payer: Self-pay | Admitting: Internal Medicine

## 2023-07-22 ENCOUNTER — Ambulatory Visit (HOSPITAL_COMMUNITY): Admitting: Anesthesiology

## 2023-07-22 DIAGNOSIS — D124 Benign neoplasm of descending colon: Secondary | ICD-10-CM | POA: Insufficient documentation

## 2023-07-22 DIAGNOSIS — K222 Esophageal obstruction: Secondary | ICD-10-CM

## 2023-07-22 DIAGNOSIS — K589 Irritable bowel syndrome without diarrhea: Secondary | ICD-10-CM | POA: Insufficient documentation

## 2023-07-22 DIAGNOSIS — K5730 Diverticulosis of large intestine without perforation or abscess without bleeding: Secondary | ICD-10-CM | POA: Insufficient documentation

## 2023-07-22 DIAGNOSIS — K2980 Duodenitis without bleeding: Secondary | ICD-10-CM | POA: Diagnosis not present

## 2023-07-22 DIAGNOSIS — Z1211 Encounter for screening for malignant neoplasm of colon: Secondary | ICD-10-CM | POA: Insufficient documentation

## 2023-07-22 DIAGNOSIS — D121 Benign neoplasm of appendix: Secondary | ICD-10-CM | POA: Insufficient documentation

## 2023-07-22 DIAGNOSIS — Z87891 Personal history of nicotine dependence: Secondary | ICD-10-CM | POA: Insufficient documentation

## 2023-07-22 DIAGNOSIS — R1310 Dysphagia, unspecified: Secondary | ICD-10-CM | POA: Insufficient documentation

## 2023-07-22 DIAGNOSIS — Z79899 Other long term (current) drug therapy: Secondary | ICD-10-CM | POA: Diagnosis not present

## 2023-07-22 DIAGNOSIS — K449 Diaphragmatic hernia without obstruction or gangrene: Secondary | ICD-10-CM | POA: Insufficient documentation

## 2023-07-22 DIAGNOSIS — K319 Disease of stomach and duodenum, unspecified: Secondary | ICD-10-CM | POA: Insufficient documentation

## 2023-07-22 DIAGNOSIS — K3189 Other diseases of stomach and duodenum: Secondary | ICD-10-CM

## 2023-07-22 DIAGNOSIS — K219 Gastro-esophageal reflux disease without esophagitis: Secondary | ICD-10-CM | POA: Insufficient documentation

## 2023-07-22 HISTORY — PX: MALONEY DILATION: SHX5535

## 2023-07-22 HISTORY — PX: COLONOSCOPY: SHX5424

## 2023-07-22 HISTORY — PX: ESOPHAGOGASTRODUODENOSCOPY: SHX5428

## 2023-07-22 SURGERY — COLONOSCOPY
Anesthesia: General

## 2023-07-22 MED ORDER — PROPOFOL 10 MG/ML IV BOLUS
INTRAVENOUS | Status: DC | PRN
  Administered 2023-07-22: 100 ug/kg/min via INTRAVENOUS
  Administered 2023-07-22: 30 mg via INTRAVENOUS
  Administered 2023-07-22: 100 mg via INTRAVENOUS

## 2023-07-22 MED ORDER — PHENYLEPHRINE 80 MCG/ML (10ML) SYRINGE FOR IV PUSH (FOR BLOOD PRESSURE SUPPORT)
PREFILLED_SYRINGE | INTRAVENOUS | Status: DC | PRN
Start: 2023-07-22 — End: 2023-07-22
  Administered 2023-07-22 (×2): 80 ug via INTRAVENOUS

## 2023-07-22 MED ORDER — LACTATED RINGERS IV SOLN: INTRAVENOUS | Status: DC | PRN

## 2023-07-22 MED ORDER — LIDOCAINE 2% (20 MG/ML) 5 ML SYRINGE
INTRAMUSCULAR | Status: DC | PRN
Start: 2023-07-22 — End: 2023-07-22
  Administered 2023-07-22: 80 mg via INTRAVENOUS

## 2023-07-22 NOTE — Op Note (Signed)
 [facility] Patient Name: Michele Villa Procedure Date: 07/22/2023 7:54 AM MRN: [id number] Date of Birth: 01-11-1946 Attending MD: Gemma Kelp , MD, [id number] CSN: [id number] Age: 78 Admit Type: Outpatient Procedure:                Upper GI endoscopy Indications:              Dysphagia Providers:                Gemma Kelp, MD, Troy Furnish. Hazeline Lister RN, RN,                            Sharlette Dayhoff Technician, Technician, [place] Wilson,                            Technician Referring MD:              Medicines:                Propofol  per Anesthesia Complications:            No immediate complications. Estimated Blood Loss:     Estimated blood loss was minimal. Procedure:                Pre-Anesthesia Assessment:                           - Prior to the procedure, a History and Physical                            was performed, and patient medications and                            allergies were reviewed. The patient's tolerance of                            previous anesthesia was also reviewed. The risks                            and benefits of the procedure and the sedation                            options and risks were discussed with the patient.                            All questions were answered, and informed consent                            was obtained. Prior Anticoagulants: The patient has                            taken no anticoagulant or antiplatelet agents. ASA                            Grade Assessment: II - A patient with mild systemic  disease. After reviewing the risks and benefits,                            the patient was deemed in satisfactory condition to                            undergo the procedure.                           After obtaining informed consent, the endoscope was                            passed under direct vision. Throughout the                            procedure, the patient's blood  pressure, pulse, and                            oxygen saturations were monitored continuously. The                            [id number]-[id number] ([id number]) scope was introduced through the                            mouth, and advanced to the second part of duodenum.                            The upper GI endoscopy was accomplished without                            difficulty. The patient tolerated the procedure                            well. Scope In: 8:17:26 AM Scope Out: 8:28:46 AM Total Procedure Duration: 0 hours 11 minutes 20 seconds  Findings:      A mild Schatzki ring was found at the gastroesophageal junction.       Otherwise, gastric mucosa appeared normal. Patent pylorus.      A small hiatal hernia was present.      Examination the 1st and 2nd portion of the duodenum revealed       identification of the ampulla and the second portion of the duodenum in       the proximal second portion of the duodenum there is a 1.5 cm, soft       multi lobulated nodule -positive pillow sign. Appears submucosal.The       scope was withdrawn. Dilation was performed with a Maloney dilator with       mild resistance at 54 Fr. The dilation site was examined following       endoscope reinsertion and showed moderate improvement in luminal       narrowing. Estimated blood loss was minimal. Finally, the duodenal       nodule was biopsied bite on bite. This was done without complication       minimal bleeding. Impression:               - Mild  Schatzki ring. Dilated.                           - Small hiatal hernia.                           - D2 duodenal nodule status post biopsy Moderate Sedation:      Moderate (conscious) sedation was personally administered by an       anesthesia professional. The following parameters were monitored: oxygen       saturation, heart rate, blood pressure, respiratory rate, EKG, adequacy       of pulmonary ventilation, and response to care. Recommendation:           -  Patient has a contact number available for                            emergencies. The signs and symptoms of potential                            delayed complications were discussed with the                            patient. Return to normal activities tomorrow.                            Written discharge instructions were provided to the                            patient.                           - Advance diet as tolerated.                           - Continue present medications.                           - Return to my office in 3 months. See colonoscopy                            report. Procedure Code(s):        --- Professional ---                           [phone], Esophagogastroduodenoscopy, flexible,                            transoral; diagnostic, including collection of                            specimen(s) by brushing or washing, when performed                            (separate procedure) Diagnosis Code(s):        --- Professional ---  K22.2, Esophageal obstruction                           K44.9, Diaphragmatic hernia without obstruction or                            gangrene                           R13.10, Dysphagia, unspecified CPT copyright 2022 American Medical Association. All rights reserved. The codes documented in this report are preliminary and upon coder review may  be revised to meet current compliance requirements. Windsor Hatcher. Aydian Dimmick, MD Gemma Kelp, MD 07/22/2023 9:30:02 AM This report has been signed electronically. Number of Addenda: 0

## 2023-07-22 NOTE — Anesthesia Postprocedure Evaluation (Signed)
 Anesthesia Post Note  Patient: Michele Villa  Procedure(s) Performed: COLONOSCOPY EGD (ESOPHAGOGASTRODUODENOSCOPY) DILATION, ESOPHAGUS, USING MALONEY DILATOR  Patient location during evaluation: Endoscopy Anesthesia Type: General Level of consciousness: awake and alert Pain management: pain level controlled Vital Signs Assessment: post-procedure vital signs reviewed and stable Respiratory status: spontaneous breathing, nonlabored ventilation, respiratory function stable and patient connected to nasal cannula oxygen Cardiovascular status: blood pressure returned to baseline and stable Postop Assessment: no apparent nausea or vomiting Anesthetic complications: no   There were no known notable events for this encounter.   Last Vitals:  Vitals:   07/22/23 0856 07/22/23 0859  BP: (!) 83/44 98/72  Pulse: 75   Resp: 17   Temp: 36.4 C   SpO2: 98%     Last Pain:  Vitals:   07/22/23 0858  TempSrc:   PainSc: 0-No pain                 Luiscarlos Kaczmarczyk L Leno Mathes

## 2023-07-22 NOTE — Discharge Instructions (Addendum)
 EGD Discharge instructions Please read the instructions outlined below and refer to this sheet in the next few weeks. These discharge instructions provide you with general information on caring for yourself after you leave the hospital. Your doctor may also give you specific instructions. While your treatment has been planned according to the most current medical practices available, unavoidable complications occasionally occur. If you have any problems or questions after discharge, please call your doctor. ACTIVITY You may resume your regular activity but move at a slower pace for the next 24 hours.  Take frequent rest periods for the next 24 hours.  Walking will help expel (get rid of) the air and reduce the bloated feeling in your abdomen.  No driving for 24 hours (because of the anesthesia (medicine) used during the test).  You may shower.  Do not sign any important legal documents or operate any machinery for 24 hours (because of the anesthesia used during the test).  NUTRITION Drink plenty of fluids.  You may resume your normal diet.  Begin with a light meal and progress to your normal diet.  Avoid alcoholic beverages for 24 hours or as instructed by your caregiver.  MEDICATIONS You may resume your normal medications unless your caregiver tells you otherwise.  WHAT YOU CAN EXPECT TODAY You may experience abdominal discomfort such as a feeling of fullness or "gas" pains.  FOLLOW-UP Your doctor will discuss the results of your test with you.  SEEK IMMEDIATE MEDICAL ATTENTION IF ANY OF THE FOLLOWING OCCUR: Excessive nausea (feeling sick to your stomach) and/or vomiting.  Severe abdominal pain and distention (swelling).  Trouble swallowing.  Temperature over 101 F (37.8 C).  Rectal bleeding or vomiting of blood.     Colonoscopy Discharge Instructions  Read the instructions outlined below and refer to this sheet in the next few weeks. These discharge instructions provide you with  general information on caring for yourself after you leave the hospital. Your doctor may also give you specific instructions. While your treatment has been planned according to the most current medical practices available, unavoidable complications occasionally occur. If you have any problems or questions after discharge, call Dr. Riley Cheadle at [phone]. ACTIVITY You may resume your regular activity, but move at a slower pace for the next 24 hours.  Take frequent rest periods for the next 24 hours.  Walking will help get rid of the air and reduce the bloated feeling in your belly (abdomen).  No driving for 24 hours (because of the medicine (anesthesia) used during the test).   Do not sign any important legal documents or operate any machinery for 24 hours (because of the anesthesia used during the test).  NUTRITION Drink plenty of fluids.  You may resume your normal diet as instructed by your doctor.  Begin with a light meal and progress to your normal diet. Heavy or fried foods are harder to digest and may make you feel sick to your stomach (nauseated).  Avoid alcoholic beverages for 24 hours or as instructed.  MEDICATIONS You may resume your normal medications unless your doctor tells you otherwise.  WHAT YOU CAN EXPECT TODAY Some feelings of bloating in the abdomen.  Passage of more gas than usual.  Spotting of blood in your stool or on the toilet paper.  IF YOU HAD POLYPS REMOVED DURING THE COLONOSCOPY: No aspirin products for 7 days or as instructed.  No alcohol  for 7 days or as instructed.  Eat a soft diet for the next 24 hours.  FINDING OUT THE RESULTS OF YOUR TEST Not all test results are available during your visit. If your test results are not back during the visit, make an appointment with your caregiver to find out the results. Do not assume everything is normal if you have not heard from your caregiver or the medical facility. It is important for you to follow up on all of your test  results.  SEEK IMMEDIATE MEDICAL ATTENTION IF: You have more than a spotting of blood in your stool.  Your belly is swollen (abdominal distention).  You are nauseated or vomiting.  You have a temperature over 101.  You have abdominal pain or discomfort that is severe or gets worse throughout the day.    Your esophagus was nicely dilated.  Nodule in your duodenum biopsied  2 polyps removed from your colon  Diverticulosis and colon polyp information provided  Further recommendations to follow pending review of pathology report  Keep your office visit appointment with us  in 3 months

## 2023-07-22 NOTE — Transfer of Care (Signed)
 Immediate Anesthesia Transfer of Care Note  Patient: Michele Villa  Procedure(s) Performed: COLONOSCOPY EGD (ESOPHAGOGASTRODUODENOSCOPY) DILATION, ESOPHAGUS, USING MALONEY DILATOR  Patient Location: PACU  Anesthesia Type:General  Level of Consciousness: awake, alert , and oriented  Airway & Oxygen Therapy: Patient Spontanous Breathing  Post-op Assessment: Report given to RN and Post -op Vital signs reviewed and stable  Post vital signs: Reviewed and stable  Last Vitals:  Vitals Value Taken Time  BP 98/72 07/22/23 0859  Temp 36.4 C 07/22/23 0856  Pulse 75 07/22/23 0856  Resp 17 07/22/23 0856  SpO2 98 % 07/22/23 0856    Last Pain:  Vitals:   07/22/23 0858  TempSrc:   PainSc: 0-No pain      Patients Stated Pain Goal: 4 (07/22/23 0722)  Complications: No notable events documented.

## 2023-07-22 NOTE — Op Note (Signed)
 [facility] Patient Name: Michele Villa Procedure Date: 07/22/2023 7:49 AM MRN: [id number] Date of Birth: 04-20-1945 Attending MD: Gemma Kelp , MD, [id number] CSN: [id number] Age: 78 Admit Type: Outpatient Procedure:                Colonoscopy Indications:              Screening for colorectal malignant neoplasm Providers:                Gemma Kelp, MD, Troy Furnish. Hazeline Lister RN, RN,                            Sharlette Dayhoff Technician, Technician, [place] Wilson,                            Technician Referring MD:              Medicines:                Propofol  per Anesthesia Complications:            No immediate complications. Estimated Blood Loss:     Estimated blood loss was minimal. Procedure:                Pre-Anesthesia Assessment:                           - Prior to the procedure, a History and Physical                            was performed, and patient medications and                            allergies were reviewed. The patient's tolerance of                            previous anesthesia was also reviewed. The risks                            and benefits of the procedure and the sedation                            options and risks were discussed with the patient.                            All questions were answered, and informed consent                            was obtained. Prior Anticoagulants: The patient has                            taken no anticoagulant or antiplatelet agents. ASA                            Grade Assessment: III - A patient with severe  systemic disease. After reviewing the risks and                            benefits, the patient was deemed in satisfactory                            condition to undergo the procedure.                           After obtaining informed consent, the colonoscope                            was passed under direct vision. Throughout the                             procedure, the patient's blood pressure, pulse, and                            oxygen saturations were monitored continuously. The                            [id number]) scope was introduced through the                            anus and advanced to the the cecum, identified by                            appendiceal orifice and ileocecal valve. The                            colonoscopy was performed without difficulty. The                            patient tolerated the procedure well. The quality                            of the bowel preparation was adequate. The                            ileocecal valve, appendiceal orifice, and rectum                            were photographed. The quality of the bowel                            preparation was adequate. The entire colon was well                            visualized. Scope In: 8:36:37 AM Scope Out: 8:51:04 AM Scope Withdrawal Time: 0 hours 6 minutes 28 seconds  Total Procedure Duration: 0 hours 14 minutes 27 seconds  Findings:      The perianal and digital rectal examinations were normal.      Scattered medium-mouthed diverticula were found in the sigmoid colon and  descending colon.      Two semi-pedunculated polyps were found in the mid descending colon and       appendiceal orifice. The polyps were 4 to 6 mm in size. These polyps       were removed with a hot snare. Resection and retrieval were complete.       Estimated blood loss: none.      The exam was otherwise without abnormality on direct and retroflexion       views. Impression:               - Diverticulosis in the sigmoid colon and in the                            descending colon.                           - Two 4 to 6 mm polyps in the mid descending colon                            and at the appendiceal orifice, removed with a hot                            snare. Resected and retrieved.                           - The examination was otherwise  normal on direct                            and retroflexion views. Moderate Sedation:      Moderate (conscious) sedation was personally administered by an       anesthesia professional. The following parameters were monitored: oxygen       saturation, heart rate, blood pressure, respiratory rate, EKG, adequacy       of pulmonary ventilation, and response to care. Recommendation:           - Patient has a contact number available for                            emergencies. The signs and symptoms of potential                            delayed complications were discussed with the                            patient. Return to normal activities tomorrow.                            Written discharge instructions were provided to the                            patient.                           - Advance diet as tolerated.                           -  Continue present medications.                           - Repeat colonoscopy date to be determined after                            pending pathology results are reviewed for                            surveillance based on pathology results.                           - Return to GI office in 3 months. See EGD report. Procedure Code(s):        --- Professional ---                           [phone], Colonoscopy, flexible; with removal of                            tumor(s), polyp(s), or other lesion(s) by snare                            technique Diagnosis Code(s):        --- Professional ---                           Z12.11, Encounter for screening for malignant                            neoplasm of colon                           D12.4, Benign neoplasm of descending colon                           D12.1, Benign neoplasm of appendix                           K57.30, Diverticulosis of large intestine without                            perforation or abscess without bleeding CPT copyright 2022 American Medical Association. All rights  reserved. The codes documented in this report are preliminary and upon coder review may  be revised to meet current compliance requirements. [place] Hatcher. Mikala Podoll, MD Gemma Kelp, MD 07/22/2023 9:26:32 AM This report has been signed electronically. Number of Addenda: 0

## 2023-07-22 NOTE — Interval H&P Note (Signed)
 History and Physical Interval Note:  07/22/2023 7:58 AM  Michele Villa  has presented today for surgery, with the diagnosis of DYSPHAGIA, CA SCREENING.  The various methods of treatment have been discussed with the patient and family. After consideration of risks, benefits and other options for treatment, the patient has consented to  Procedure(s) with comments: COLONOSCOPY (N/A) - 800am, asa 2 EGD (ESOPHAGOGASTRODUODENOSCOPY) (N/A) DILATION, ESOPHAGUS (N/A) as a surgical intervention.  The patient's history has been reviewed, patient examined, no change in status, stable for surgery.  I have reviewed the patient's chart and labs.  Questions were answered to the patient's satisfaction.     [name]  No change.  EGD with esophageal dilation as feasible/appropriate plus colonoscopy today for screening purposes. The risks, benefits, limitations, imponderables and alternatives regarding both EGD and colonoscopy have been reviewed with the patient. Questions have been answered. All parties agreeable.

## 2023-07-26 ENCOUNTER — Encounter (HOSPITAL_COMMUNITY): Payer: Self-pay | Admitting: Internal Medicine

## 2023-07-26 LAB — SURGICAL PATHOLOGY

## 2023-08-02 ENCOUNTER — Other Ambulatory Visit: Payer: Self-pay | Admitting: *Deleted

## 2023-08-02 DIAGNOSIS — K3189 Other diseases of stomach and duodenum: Secondary | ICD-10-CM

## 2023-08-20 ENCOUNTER — Ambulatory Visit (HOSPITAL_COMMUNITY)
Admission: RE | Admit: 2023-08-20 | Discharge: 2023-08-20 | Disposition: A | Discharge status: Home / Self Care | Source: Ambulatory Visit | Attending: Internal Medicine | Admitting: Internal Medicine

## 2023-08-20 ENCOUNTER — Encounter (HOSPITAL_COMMUNITY): Payer: Self-pay

## 2023-08-20 DIAGNOSIS — K3189 Other diseases of stomach and duodenum: Secondary | ICD-10-CM | POA: Diagnosis present

## 2023-08-20 MED ORDER — IOHEXOL 300 MG/ML  SOLN
100.0000 mL | Freq: Once | INTRAMUSCULAR | Status: AC | PRN
  Administered 2023-08-20: 85 mL via INTRAVENOUS

## 2023-08-31 ENCOUNTER — Ambulatory Visit: Payer: Self-pay | Admitting: Internal Medicine

## 2023-09-08 NOTE — Progress Notes (Signed)
 Noted

## 2023-09-14 ENCOUNTER — Encounter: Payer: Self-pay | Admitting: Internal Medicine

## 2023-09-14 ENCOUNTER — Ambulatory Visit: Admitting: Internal Medicine

## 2023-09-14 VITALS — BP 131/73 | HR 68 | Temp 98.0°F | Ht 63.5 in | Wt 132.6 lb

## 2023-09-14 DIAGNOSIS — K862 Cyst of pancreas: Secondary | ICD-10-CM

## 2023-09-14 DIAGNOSIS — K869 Disease of pancreas, unspecified: Secondary | ICD-10-CM

## 2023-09-14 DIAGNOSIS — K582 Mixed irritable bowel syndrome: Secondary | ICD-10-CM

## 2023-09-14 DIAGNOSIS — K589 Irritable bowel syndrome without diarrhea: Secondary | ICD-10-CM

## 2023-09-14 DIAGNOSIS — K3189 Other diseases of stomach and duodenum: Secondary | ICD-10-CM

## 2023-09-14 DIAGNOSIS — Z860101 Personal history of adenomatous and serrated colon polyps: Secondary | ICD-10-CM

## 2023-09-14 DIAGNOSIS — K219 Gastro-esophageal reflux disease without esophagitis: Secondary | ICD-10-CM | POA: Diagnosis not present

## 2023-09-14 MED ORDER — HYOSCYAMINE SULFATE 0.125 MG SL SUBL: 0.1250 mg | SUBLINGUAL_TABLET | Freq: Four times a day (QID) | SUBLINGUAL | 5 refills | Status: AC | PRN

## 2023-09-14 NOTE — Progress Notes (Unsigned)
 Primary Care Physician:  Atilano Deward ORN, MD Primary Gastroenterologist:  Dr. Shaaron  Pre-Procedure History & Physical: HPI:  Michele Villa is a 78 y.o. female here for she is here for follow-up of GERD and altered bowel function.  She produces stool diary.  Describes [place] 3-4 stools a first thing in the morning I may have 2 or 3 more 5/6 stools later in the day.  No incontinence.  To be on look the look out for bathrooms when she goes out in the morning.  Sometimes has no symptoms whatsoever.  History of couple small adenomas removed from her colon earlier this year; consider 1 more surveillance colonoscopy depending on office in 5 years.  Small lipomatous appearing lesion in her duodenum seen on EGD not seen on CT.  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  Patient does not like MRIs.  I performed a CT scan which revealed a 2.8 cm lesion for which a 1 year follow-up CT was recommended.  No evidence of lipoma of the lesion in her duodenum.  Past Medical History:  Diagnosis Date   Arthritis    neck, shoulder and knees   Cancer (HCC)    Skin   GERD (gastroesophageal reflux disease)    H. pylori infection 2012   treated by Dr Atilano   Hypercholesterolemia    Hyperlipidemia    IBS (irritable bowel syndrome)     Past Surgical History:  Procedure Laterality Date   BIOPSY N/A 05/25/2012   Procedure: BIOPSY;  Surgeon: Lamar CHRISTELLA Shaaron, MD;  [facility];  Service: Endoscopy;  Laterality: N/A;     COLONOSCOPY  11/15/2002   RMR:Two anal papilla; otherwise, normal rectum, normal colon normal terminal ileum   COLONOSCOPY N/A 05/25/2012   Dr. Patrece Tallie:Shallow left sided colonic diverticulosis. Minimally hemorrhoids. Status post segmental biopsy, benign   COLONOSCOPY N/A 07/22/2023   Procedure: COLONOSCOPY;  Surgeon: Shaaron Lamar CHRISTELLA, MD;  [facility];  Service: Endoscopy;  Laterality: N/A;  800am, asa 2   ESOPHAGOGASTRODUODENOSCOPY N/A 07/22/2023   Procedure: EGD (ESOPHAGOGASTRODUODENOSCOPY);  Surgeon: Shaaron Lamar CHRISTELLA, MD;  [facility];  Service: Endoscopy;  Laterality: N/A;   ESOPHAGOGASTRODUODENOSCOPY (EGD) WITH ESOPHAGEAL DILATION N/A 05/25/2012   Dr. Tye reflux esophagitis/Hiatal hernia, soft distal peptic stricture status post dilation. Small bowel biopsy benign.   MALONEY DILATION  07/22/2023   Procedure: DILATION, ESOPHAGUS, USING MALONEY DILATOR;  Surgeon: Shaaron Lamar CHRISTELLA, MD;  [facility];  Service: Endoscopy;;   TUBAL LIGATION      Prior to Admission medications   Medication Sig Start Date End Date Taking? Authorizing Provider  DEXILANT  60 MG capsule TAKE ONE CAPSULE BY MOUTH EVERY DAY 04/25/18  Yes Shirlean Therisa ORN, NP  ezetimibe (ZETIA) 10 MG tablet Take 10 mg by mouth daily.   Yes [provider]  Garlic 1000 MG CAPS Take 1 capsule by mouth daily.   Yes [provider]  loratadine (CLARITIN) 10 MG tablet Take 10 mg by mouth daily.   Yes [provider]  Multiple Vitamins-Minerals (PRESERVISION AREDS PO) Take 1 capsule by mouth in the morning and at bedtime.   Yes [provider]    Allergies as of 09/14/2023 - Review Complete 09/14/2023  Allergen Reaction Noted   Statins Hives and Itching 05/12/2012  Family History  Problem Relation Age of Onset   GER disease Mother    Diabetes Brother    CAD Other    Lung cancer Father    GER disease Father    Colon cancer Neg Hx     Social History   Socioeconomic History   Marital status: Married    Spouse name: Not on file   Number of children: 1   Years of education: Not on file   Highest education level: Not on file  Occupational History   Occupation: retired, [employer] Nutritional therapist  Tobacco Use   Smoking status: Former    Current packs/day: 0.00    Types: Cigarettes    Quit date:  03/23/1980    Years since quitting: 43.5   Smokeless tobacco: Never  Vaping Use   Vaping status: Never Used  Substance and Sexual Activity   Alcohol  use: No    Alcohol /week: 0.0 standard drinks of alcohol     Comment: glass of wine with dinner twice a month   Drug use: No   Sexual activity: Yes  Other Topics Concern   Not on file  Social History Narrative   Lives w/ husband   Social Drivers of Health   Financial Resource Strain: Not on file  Food Insecurity: Not on file  Transportation Needs: Not on file  Physical Activity: Sufficiently Active (09/07/2018)   Received from [facility]   Exercise Vital Sign    On average, how many days per week do you engage in moderate to strenuous exercise (like a brisk walk)?: 7 days    On average, how many minutes do you engage in exercise at this level?: 40 min  Stress: Not on file  Social Connections: Not on file  Intimate Partner Violence: Not At Risk (09/07/2018)   [facility]   Humiliation, Afraid, Rape, and Kick questionnaire    Within the last year, have you been afraid of your partner or ex-partner?: No    Within the last year, have you been humiliated or emotionally abused in other ways by your partner or ex-partner?: No    Within the last year, have you been kicked, hit, slapped, or otherwise physically hurt by your partner or ex-partner?: No    Within the last year, have you been raped or forced to have any kind of sexual activity by your partner or ex-partner?: No    Review of Systems: See HPI, otherwise negative ROS  Physical Exam: BP 131/73 (BP Location: Right Arm, Patient Position: Sitting, Cuff Size: Normal)   Pulse 68   Temp 98 F (36.7 C) (Oral)   Ht 5' 3.5 (1.613 m)   Wt 132 lb 9.6 oz (60.1 kg)   SpO2 97%   BMI 23.12 kg/m  General:   Alert,  Well-developed, well-nourished, pleasant and cooperative in NAD Abdomen: Non-distended, normal bowel sounds.  Soft and nontender without appreciable mass  or hepatosplenomegaly.  Impression/Plan: 78 year old lady with GERD now well-controlled on Dexilant  60 mg daily.  Dysphagia resolved status post Maloney dilation.  Small lipomas and duodenum not seen on CT's pancreatic cyst noted on recent MRI.  Colonic adenomas; consider 1 more colonoscopy in 5 years if health is acceptable  Mixed IBS.  Recommendations:   Continue Dexilant  60 mg daily.  This medication is best taken 30 to 60 minutes before breakfast  For your bowel symptoms, new prescription for the Levsin  sublingual 0.125 mg tablets.  Dispense 60 with 5 refills.  Take 1 of  these up to 20 minutes before you eat.  May take before breakfast lunch and supper and 1 at bedtime as needed for bowel urgency. If you do not have any bowel symptoms you do not need to take it.  Have the medication on hand just in case.  Will plan to repeat the CT of your pancreas and duodenum in 1 year as discussed  Will consider 1 more colonoscopy in 5 years depending on your overall health  Plan to see you back in the office recheck in 3 months   Notice: This dictation was prepared with Dragon dictation along with smaller phrase technology. Any transcriptional errors that result from this process are unintentional and may not be corrected upon review.

## 2023-09-14 NOTE — Patient Instructions (Signed)
 It was nice to see you again today!  Continue Dexilant  60 mg daily.  This medication is best taken 30 to 60 minutes before breakfast  For your bowel symptoms, new prescription for the Levsin  sublingual 0.125 mg tablets.  Dispense 60 with 5 refills.  Take 1 of these up to 20 minutes before you eat.  May take before breakfast lunch and supper and 1 at bedtime as needed for bowel urgency. If you do not have any bowel symptoms you do not need to take it.  Have the medication on hand just in case.  Will plan to repeat the CT of your pancreas and duodenum in 1 year as discussed  Will consider 1 more colonoscopy in 5 years depending on your overall health  Plan to see you back in the office recheck in 3 months

## 2023-11-11 ENCOUNTER — Encounter: Payer: Self-pay | Admitting: Internal Medicine

## 2024-02-23 ENCOUNTER — Ambulatory Visit: Admitting: Surgical

## 2024-02-23 ENCOUNTER — Encounter: Payer: Self-pay | Admitting: Surgical

## 2024-02-23 ENCOUNTER — Other Ambulatory Visit: Payer: Self-pay

## 2024-02-23 DIAGNOSIS — M25562 Pain in left knee: Secondary | ICD-10-CM

## 2024-02-23 NOTE — Progress Notes (Unsigned)
 Office Visit Note   Patient: Michele Villa           Date of Birth: Jun 23, 1945           MRN: [id number] Visit Date: 02/23/2024 Requested by: Jolee Greig DEL, PA-C [facility] [address] [place],  [place] [address] PCP: Atilano Deward ORN, MD  Subjective: Chief Complaint  Patient presents with  . left knee pain    HPI: Michele Villa is a 78 y.o. female who presents to the office reporting ***.                ROS: All systems reviewed are negative as they relate to the chief complaint within the history of present illness.  Patient denies fevers or chills.  Assessment & Plan: Visit Diagnoses:  1. Acute pain of left knee     Plan: ***  Follow-Up Instructions: No follow-ups on file.   Orders:  Orders Placed This Encounter  Procedures  . DG Knee AP/LAT W/Sunrise Left   No orders of the defined types were placed in this encounter.     Procedures: No procedures performed   Clinical Data: No additional findings.  Objective: Vital Signs: There were no vitals taken for this visit.  Physical Exam:  Constitutional: Patient appears well-developed HEENT:  Head: Normocephalic Eyes:EOM are normal Neck: Normal range of motion Cardiovascular: Normal rate Pulmonary/chest: Effort normal Neurologic: Patient is alert Skin: Skin is warm Psychiatric: Patient has normal mood and affect  Ortho Exam: ***  Specialty Comments:  No specialty comments available.  Imaging: No results found.   PMFS History: Patient Active Problem List   Diagnosis Date Noted  . Encounter for screening colonoscopy 06/22/2023  . Pancreatic lesion 06/22/2023  . Rectal bleeding 08/05/2014  . H. pylori infection 02/08/2014  . IBS (irritable bowel syndrome) 06/20/2012  . Diarrhea 05/12/2012  . Dysphagia 05/12/2012  . GERD (gastroesophageal reflux disease) 05/12/2012  . Incontinent of feces 05/12/2012   Past Medical History:  Diagnosis Date  . Arthritis    neck, shoulder and knees  .  Cancer (HCC)    Skin  . GERD (gastroesophageal reflux disease)   . H. pylori infection 2012   treated by Dr Atilano  . Hypercholesterolemia   . Hyperlipidemia   . IBS (irritable bowel syndrome)     Family History  Problem Relation Age of Onset  . GER disease Mother   . Diabetes Brother   . CAD Other   . Lung cancer Father   . GER disease Father   . Colon cancer Neg Hx     Past Surgical History:  Procedure Laterality Date  . BIOPSY N/A 05/25/2012   Procedure: BIOPSY;  Surgeon: Lamar CHRISTELLA Hollingshead, MD;  [facility];  Service: Endoscopy;  Laterality: N/A;    . COLONOSCOPY  11/15/2002   RMR:Two anal papilla; otherwise, normal rectum, normal colon normal terminal ileum  . COLONOSCOPY N/A 05/25/2012   Dr. Rourk:Shallow left sided colonic diverticulosis. Minimally hemorrhoids. Status post segmental biopsy, benign  . COLONOSCOPY N/A 07/22/2023   Procedure: COLONOSCOPY;  Surgeon: Hollingshead Lamar CHRISTELLA, MD;  [facility];  Service: Endoscopy;  Laterality: N/A;  800am, asa 2  . ESOPHAGOGASTRODUODENOSCOPY N/A 07/22/2023   Procedure: EGD (ESOPHAGOGASTRODUODENOSCOPY);  Surgeon: Hollingshead Lamar CHRISTELLA, MD;  [facility];  Service: Endoscopy;  Laterality: N/A;  . ESOPHAGOGASTRODUODENOSCOPY (EGD) WITH ESOPHAGEAL DILATION N/A 05/25/2012   Dr. Tye reflux esophagitis/Hiatal hernia, soft distal peptic stricture status  post dilation. Small bowel biopsy benign.  SABRA MALONEY DILATION  07/22/2023   Procedure: DILATION, ESOPHAGUS, USING MALONEY DILATOR;  Surgeon: Shaaron Lamar HERO, MD;  [facility];  Service: Endoscopy;;  . TUBAL LIGATION     Social History   Occupational History  . Occupation: retired, [employer] Nutritional Therapist  Tobacco Use  . Smoking status: Former    Current packs/day: 0.00    Types: Cigarettes    Quit date: 03/23/1980    Years since quitting: 43.9  . Smokeless tobacco: Never  Vaping Use  . Vaping status: Never Used  Substance and Sexual Activity  .  Alcohol  use: No    Alcohol /week: 0.0 standard drinks of alcohol     Comment: glass of wine with dinner twice a month  . Drug use: No  . Sexual activity: Yes

## 2024-02-24 ENCOUNTER — Other Ambulatory Visit (HOSPITAL_COMMUNITY): Payer: Self-pay | Admitting: Physician Assistant

## 2024-02-24 DIAGNOSIS — R109 Unspecified abdominal pain: Secondary | ICD-10-CM

## 2024-02-24 DIAGNOSIS — R319 Hematuria, unspecified: Secondary | ICD-10-CM

## 2024-02-27 ENCOUNTER — Encounter: Payer: Self-pay | Admitting: Surgical

## 2024-03-01 ENCOUNTER — Ambulatory Visit (HOSPITAL_COMMUNITY)
Admission: RE | Admit: 2024-03-01 | Discharge: 2024-03-01 | Attending: Physician Assistant | Admitting: Physician Assistant

## 2024-03-01 DIAGNOSIS — R319 Hematuria, unspecified: Secondary | ICD-10-CM | POA: Diagnosis present

## 2024-03-01 DIAGNOSIS — R109 Unspecified abdominal pain: Secondary | ICD-10-CM | POA: Insufficient documentation

## 2024-03-01 LAB — POCT I-STAT CREATININE: Creatinine, Ser: 0.9 mg/dL (ref 0.44–1.00)

## 2024-03-01 MED ORDER — IOHEXOL 300 MG/ML  SOLN
100.0000 mL | Freq: Once | INTRAMUSCULAR | Status: AC | PRN
  Administered 2024-03-01: 100 mL via INTRAVENOUS
